# Patient Record
Sex: Male | Born: 1935 | Race: White | Hispanic: No | Marital: Married | Smoking: Never smoker
Health system: Southern US, Community
[De-identification: ages and names within clinical notes are randomized; demographics above are authoritative.]

## PROBLEM LIST (undated history)

## (undated) DIAGNOSIS — E119 Type 2 diabetes mellitus without complications: Secondary | ICD-10-CM

## (undated) DIAGNOSIS — K648 Other hemorrhoids: Secondary | ICD-10-CM

## (undated) DIAGNOSIS — K5790 Diverticulosis of intestine, part unspecified, without perforation or abscess without bleeding: Secondary | ICD-10-CM

## (undated) DIAGNOSIS — Z86010 Personal history of colonic polyps: Secondary | ICD-10-CM

## (undated) DIAGNOSIS — Z800 Family history of malignant neoplasm of digestive organs: Secondary | ICD-10-CM

## (undated) DIAGNOSIS — R350 Frequency of micturition: Secondary | ICD-10-CM

## (undated) HISTORY — DX: Diverticulosis of intestine, part unspecified, without perforation or abscess without bleeding: K57.90

## (undated) HISTORY — DX: Type 2 diabetes mellitus without complications: E11.9

## (undated) HISTORY — DX: Personal history of colonic polyps: Z86.010

## (undated) HISTORY — DX: Frequency of micturition: R35.0

## (undated) HISTORY — DX: Family history of malignant neoplasm of digestive organs: Z80.0

## (undated) HISTORY — DX: Other hemorrhoids: K64.8

## (undated) HISTORY — PX: APPENDECTOMY: SHX54

---

## 1998-03-12 ENCOUNTER — Emergency Department (HOSPITAL_COMMUNITY): Admission: EM | Admit: 1998-03-12 | Discharge: 1998-03-12 | Payer: Self-pay | Admitting: Emergency Medicine

## 1998-03-12 ENCOUNTER — Encounter: Payer: Self-pay | Admitting: Emergency Medicine

## 1998-03-14 ENCOUNTER — Encounter: Payer: Self-pay | Admitting: Internal Medicine

## 1998-03-14 ENCOUNTER — Ambulatory Visit (HOSPITAL_COMMUNITY): Admission: RE | Admit: 1998-03-14 | Discharge: 1998-03-14 | Payer: Self-pay | Admitting: Internal Medicine

## 2000-11-16 ENCOUNTER — Encounter: Admission: RE | Admit: 2000-11-16 | Discharge: 2000-11-16 | Payer: Self-pay | Admitting: *Deleted

## 2001-05-23 ENCOUNTER — Encounter: Admission: RE | Admit: 2001-05-23 | Discharge: 2001-06-09 | Payer: Self-pay | Admitting: Neurosurgery

## 2003-04-04 ENCOUNTER — Encounter: Payer: Self-pay | Admitting: Internal Medicine

## 2004-05-01 ENCOUNTER — Ambulatory Visit: Payer: Self-pay | Admitting: Family Medicine

## 2004-05-15 ENCOUNTER — Ambulatory Visit: Payer: Self-pay | Admitting: Family Medicine

## 2005-05-19 ENCOUNTER — Ambulatory Visit: Payer: Self-pay | Admitting: Family Medicine

## 2005-06-04 ENCOUNTER — Ambulatory Visit: Payer: Self-pay | Admitting: Internal Medicine

## 2005-06-04 ENCOUNTER — Inpatient Hospital Stay (HOSPITAL_COMMUNITY): Admission: EM | Admit: 2005-06-04 | Discharge: 2005-06-08 | Payer: Self-pay | Admitting: Emergency Medicine

## 2005-07-29 ENCOUNTER — Ambulatory Visit: Payer: Self-pay | Admitting: Family Medicine

## 2005-09-30 ENCOUNTER — Ambulatory Visit: Payer: Self-pay | Admitting: Family Medicine

## 2006-03-26 ENCOUNTER — Ambulatory Visit: Payer: Self-pay | Admitting: Internal Medicine

## 2006-05-20 ENCOUNTER — Ambulatory Visit: Payer: Self-pay | Admitting: Family Medicine

## 2006-05-20 LAB — CONVERTED CEMR LAB: Hgb A1c MFr Bld: 6.3 % — ABNORMAL HIGH (ref 4.6–6.0)

## 2006-07-26 ENCOUNTER — Ambulatory Visit: Payer: Self-pay | Admitting: Internal Medicine

## 2006-12-15 ENCOUNTER — Ambulatory Visit: Payer: Self-pay | Admitting: Family Medicine

## 2006-12-15 DIAGNOSIS — E119 Type 2 diabetes mellitus without complications: Secondary | ICD-10-CM | POA: Insufficient documentation

## 2006-12-15 LAB — CONVERTED CEMR LAB
Glucose, Urine, Semiquant: NEGATIVE
Protein, U semiquant: NEGATIVE
Specific Gravity, Urine: <1.005
WBC Urine, dipstick: NEGATIVE

## 2007-01-25 ENCOUNTER — Ambulatory Visit: Payer: Self-pay | Admitting: Family Medicine

## 2007-01-25 DIAGNOSIS — IMO0002 Reserved for concepts with insufficient information to code with codable children: Secondary | ICD-10-CM

## 2007-01-25 DIAGNOSIS — M1710 Unilateral primary osteoarthritis, unspecified knee: Secondary | ICD-10-CM | POA: Insufficient documentation

## 2007-01-25 DIAGNOSIS — T50995A Adverse effect of other drugs, medicaments and biological substances, initial encounter: Secondary | ICD-10-CM

## 2007-01-25 DIAGNOSIS — D649 Anemia, unspecified: Secondary | ICD-10-CM

## 2007-01-25 DIAGNOSIS — E785 Hyperlipidemia, unspecified: Secondary | ICD-10-CM

## 2007-01-25 DIAGNOSIS — E039 Hypothyroidism, unspecified: Secondary | ICD-10-CM | POA: Insufficient documentation

## 2007-01-31 LAB — CONVERTED CEMR LAB
AST: 23 units/L (ref 0–37)
Bilirubin, Direct: 0.4 mg/dL — ABNORMAL HIGH (ref 0.0–0.3)
Chloride: 103 meq/L (ref 96–112)
Creatinine, Ser: 1.1 mg/dL (ref 0.4–1.5)
Eosinophils Absolute: 0.2 10*3/uL (ref 0.0–0.6)
Eosinophils Relative: 2.5 % (ref 0.0–5.0)
Glucose, Bld: 100 mg/dL — ABNORMAL HIGH (ref 70–99)
HCT: 43.9 % (ref 39.0–52.0)
Hgb A1c MFr Bld: 6.1 % — ABNORMAL HIGH (ref 4.6–6.0)
MCV: 89.6 fL (ref 78.0–100.0)
Neutrophils Relative %: 70.2 % (ref 43.0–77.0)
RBC: 4.91 M/uL (ref 4.22–5.81)
RDW: 12.1 % (ref 11.5–14.6)
Sodium: 141 meq/L (ref 135–145)
Total Bilirubin: 1.7 mg/dL — ABNORMAL HIGH (ref 0.3–1.2)
Total CHOL/HDL Ratio: 5.3
Total Protein: 6.6 g/dL (ref 6.0–8.3)
WBC: 6.3 10*3/uL (ref 4.5–10.5)

## 2007-05-04 DIAGNOSIS — K5730 Diverticulosis of large intestine without perforation or abscess without bleeding: Secondary | ICD-10-CM | POA: Insufficient documentation

## 2007-05-04 DIAGNOSIS — K648 Other hemorrhoids: Secondary | ICD-10-CM | POA: Insufficient documentation

## 2008-01-07 ENCOUNTER — Ambulatory Visit: Payer: Self-pay | Admitting: Internal Medicine

## 2008-01-07 ENCOUNTER — Inpatient Hospital Stay (HOSPITAL_COMMUNITY): Admission: EM | Admit: 2008-01-07 | Discharge: 2008-01-10 | Payer: Self-pay | Admitting: Emergency Medicine

## 2008-01-17 ENCOUNTER — Ambulatory Visit: Payer: Self-pay | Admitting: Family Medicine

## 2008-01-17 DIAGNOSIS — R609 Edema, unspecified: Secondary | ICD-10-CM

## 2008-01-17 DIAGNOSIS — I4891 Unspecified atrial fibrillation: Secondary | ICD-10-CM

## 2008-03-08 ENCOUNTER — Ambulatory Visit: Payer: Self-pay | Admitting: Internal Medicine

## 2008-07-18 ENCOUNTER — Ambulatory Visit: Payer: Self-pay | Admitting: Family Medicine

## 2008-07-18 DIAGNOSIS — E559 Vitamin D deficiency, unspecified: Secondary | ICD-10-CM | POA: Insufficient documentation

## 2008-07-18 DIAGNOSIS — Z87898 Personal history of other specified conditions: Secondary | ICD-10-CM

## 2008-07-18 LAB — CONVERTED CEMR LAB
Blood in Urine, dipstick: NEGATIVE
Nitrite: NEGATIVE
Protein, U semiquant: NEGATIVE
WBC Urine, dipstick: NEGATIVE
pH: 5

## 2008-07-20 ENCOUNTER — Ambulatory Visit: Payer: Self-pay | Admitting: Family Medicine

## 2008-07-20 LAB — CONVERTED CEMR LAB: OCCULT 1: NEGATIVE

## 2008-07-25 ENCOUNTER — Encounter: Payer: Self-pay | Admitting: Family Medicine

## 2008-07-31 LAB — CONVERTED CEMR LAB
AST: 30 units/L (ref 0–37)
Albumin: 4 g/dL (ref 3.5–5.2)
Alkaline Phosphatase: 85 units/L (ref 39–117)
Basophils Absolute: 0 10*3/uL (ref 0.0–0.1)
Bilirubin, Direct: 0.3 mg/dL (ref 0.0–0.3)
Calcium: 9.4 mg/dL (ref 8.4–10.5)
Creatinine, Ser: 1 mg/dL (ref 0.4–1.5)
Creatinine,U: 45.5 mg/dL
GFR calc non Af Amer: 74.52 mL/min (ref >60)
Glucose, Bld: 124 mg/dL — ABNORMAL HIGH (ref 70–99)
HDL: 44.2 mg/dL (ref >39.00)
Hemoglobin: 14.5 g/dL (ref 13.0–17.0)
Lymphocytes Relative: 19.3 % (ref 12.0–46.0)
Microalb Creat Ratio: 17.6 mg/g (ref 0.0–30.0)
Microalb, Ur: 0.8 mg/dL (ref 0.0–1.9)
Monocytes Relative: 8.9 % (ref 3.0–12.0)
Neutro Abs: 3.5 10*3/uL (ref 1.4–7.7)
Neutrophils Relative %: 67.8 % (ref 43.0–77.0)
PSA: 0.57 ng/mL (ref 0.10–4.00)
RDW: 13.1 % (ref 11.5–14.6)
Sodium: 144 meq/L (ref 135–145)
Total Bilirubin: 1.7 mg/dL — ABNORMAL HIGH (ref 0.3–1.2)
Total CHOL/HDL Ratio: 4
VLDL: 14.4 mg/dL (ref 0.0–40.0)
Vit D, 25-Hydroxy: 34 ng/mL (ref 30–89)

## 2008-09-10 ENCOUNTER — Ambulatory Visit: Payer: Self-pay | Admitting: Family Medicine

## 2008-09-11 ENCOUNTER — Encounter (INDEPENDENT_AMBULATORY_CARE_PROVIDER_SITE_OTHER): Payer: Self-pay | Admitting: *Deleted

## 2008-09-19 LAB — CONVERTED CEMR LAB
Alkaline Phosphatase: 64 units/L (ref 39–117)
BUN: 19 mg/dL (ref 6–23)
CO2: 30 meq/L (ref 19–32)
Creatinine, Ser: 1.1 mg/dL (ref 0.4–1.5)
GFR calc non Af Amer: 66.74 mL/min (ref >60)
Glucose, Bld: 128 mg/dL — ABNORMAL HIGH (ref 70–99)
Total Bilirubin: 1.3 mg/dL — ABNORMAL HIGH (ref 0.3–1.2)

## 2008-10-11 ENCOUNTER — Encounter (INDEPENDENT_AMBULATORY_CARE_PROVIDER_SITE_OTHER): Payer: Self-pay | Admitting: *Deleted

## 2009-04-03 ENCOUNTER — Emergency Department (HOSPITAL_COMMUNITY): Admission: EM | Admit: 2009-04-03 | Discharge: 2009-04-03 | Payer: Self-pay | Admitting: Emergency Medicine

## 2009-08-26 ENCOUNTER — Ambulatory Visit: Payer: Self-pay | Admitting: Family Medicine

## 2009-08-26 DIAGNOSIS — R21 Rash and other nonspecific skin eruption: Secondary | ICD-10-CM

## 2009-08-27 LAB — CONVERTED CEMR LAB
CO2: 30 meq/L (ref 19–32)
Calcium: 9.3 mg/dL (ref 8.4–10.5)
Glucose, Bld: 89 mg/dL (ref 70–99)
Potassium: 4.5 meq/L (ref 3.5–5.1)
Sodium: 142 meq/L (ref 135–145)

## 2009-08-28 ENCOUNTER — Ambulatory Visit: Payer: Self-pay | Admitting: Family Medicine

## 2009-08-28 DIAGNOSIS — A6000 Herpesviral infection of urogenital system, unspecified: Secondary | ICD-10-CM | POA: Insufficient documentation

## 2009-08-28 DIAGNOSIS — D696 Thrombocytopenia, unspecified: Secondary | ICD-10-CM

## 2009-09-11 ENCOUNTER — Ambulatory Visit: Payer: Self-pay | Admitting: Family Medicine

## 2009-09-11 LAB — CONVERTED CEMR LAB
OCCULT 1: NEGATIVE
OCCULT 3: NEGATIVE

## 2009-12-02 ENCOUNTER — Emergency Department (HOSPITAL_COMMUNITY): Admission: EM | Admit: 2009-12-02 | Discharge: 2009-12-02 | Payer: Self-pay | Admitting: Emergency Medicine

## 2009-12-13 ENCOUNTER — Emergency Department (HOSPITAL_COMMUNITY)
Admission: EM | Admit: 2009-12-13 | Discharge: 2009-12-14 | Payer: Self-pay | Source: Home / Self Care | Admitting: Emergency Medicine

## 2010-02-09 LAB — CONVERTED CEMR LAB
Alkaline Phosphatase: 82 units/L (ref 39–117)
BUN: 15 mg/dL (ref 6–23)
Basophils Absolute: 0.1 10*3/uL (ref 0.0–0.1)
Basophils Relative: 1 % (ref 0.0–3.0)
Bilirubin Urine: NEGATIVE
Bilirubin, Direct: 0.3 mg/dL (ref 0.0–0.3)
Blood in Urine, dipstick: NEGATIVE
CO2: 30 meq/L (ref 19–32)
Calcium: 9.4 mg/dL (ref 8.4–10.5)
Chloride: 104 meq/L (ref 96–112)
Cholesterol: 171 mg/dL (ref 0–200)
Creatinine, Ser: 1 mg/dL (ref 0.4–1.5)
Eosinophils Absolute: 0.1 10*3/uL (ref 0.0–0.7)
Eosinophils Relative: 3.3 % (ref 0.0–5.0)
HCT: 42.1 % (ref 39.0–52.0)
HDL: 41.7 mg/dL (ref >39.00)
Herpes Simplex Vrs I&II-IgM Ab (EIA): 0.29
Ketones, urine, test strip: NEGATIVE
Lymphocytes Relative: 22 % (ref 12.0–46.0)
MCHC: 34.1 g/dL (ref 30.0–36.0)
MCV: 91.4 fL (ref 78.0–100.0)
MCV: 91.4 fL (ref 78.0–100.0)
Monocytes Absolute: 0.4 10*3/uL (ref 0.1–1.0)
Monocytes Absolute: 0.5 10*3/uL (ref 0.1–1.0)
Monocytes Relative: 8.4 % (ref 3.0–12.0)
Neutrophils Relative %: 64.1 % (ref 43.0–77.0)
Neutrophils Relative %: 65.1 % (ref 43.0–77.0)
Nitrite: NEGATIVE
PSA: 1.1 ng/mL (ref 0.10–4.00)
Platelets: 109 10*3/uL — ABNORMAL LOW (ref 150.0–400.0)
RBC: 4.61 M/uL (ref 4.22–5.81)
RBC: 4.74 M/uL (ref 4.22–5.81)
RDW: 13.9 % (ref 11.5–14.6)
Specific Gravity, Urine: <1.005
Total Bilirubin: 1.9 mg/dL — ABNORMAL HIGH (ref 0.3–1.2)
Total CHOL/HDL Ratio: 4
Total Protein: 6.7 g/dL (ref 6.0–8.3)
Triglycerides: 72 mg/dL (ref 0.0–149.0)
Urobilinogen, UA: 0.2
WBC: 5.1 10*3/uL (ref 4.5–10.5)

## 2010-02-13 NOTE — Assessment & Plan Note (Signed)
Summary: swollen foot--ok per dr burchette//ccm   Vital Signs:  Patient profile:   77 year old male Weight:      161 pounds Temp:     97.5 degrees F oral BP sitting:   120 / 70  (left arm) Cuff size:   regular  Vitals Entered By: Sid Falcon LPN (August 26, 2009 1:15 PM) CC: bil ankle swelling   History of Present Illness: 2-3 days of ankle and leg swelling. No dyspnea.  No regular meds. Has diabetes but not taking meds recently (should be on Glucotrol).  Also recent rash penile shaft.  Reported hx of herpes.  Rash was slightly painful but now slighlty pruritic.  Allergies: 1)  ! Penicillin 2)  ! Pcn  Past History:  Past Medical History: Last updated: 05/04/2007 Current Problems:  HEMORRHOIDS, INTERNAL (ICD-455.0) Family Hx of COLON CANCER (ICD-153.9) DIVERTICULAR DISEASE (ICD-562.10) ADENOMATOUS COLONIC POLYP (ICD-211.3) DIABETES MELLITUS, TYPE II, CONTROLLED (ICD-250.00) FREQUENCY, URINARY (ICD-788.41) PMH reviewed for relevance  Review of Systems  The patient denies fever, weight loss, chest pain, syncope, dyspnea on exertion, prolonged cough, headaches, abdominal pain, and anorexia.    Physical Exam  General:  Well-developed,well-nourished,in no acute distress; alert,appropriate and cooperative throughout examination Neck:  No deformities, masses, or tenderness noted. Lungs:  Normal respiratory effort, chest expands symmetrically. Lungs are clear to auscultation, no crackles or wheezes. Heart:  occ irregular beat. Genitalia:  on penile shaft couple of healing skin lesions 2mm -3mm Extremities:  trace pitting edema feet  bilateral.   Impression & Recommendations:  Problem # 1:  EDEMA (ICD-782.3) prob venous stasis.  May benefit from support hose.  Reluctant to start diuretics given relatively mild edema and low normal BP. Orders: Venipuncture (04540) Specimen Handling (98119) TLB-BMP (Basic Metabolic Panel-BMET) (80048-METABOL)  Problem # 2:  SKIN  RASH (ICD-782.1)  ?healing herpetic lesion.  Keep clean and topical antibiotic for next few days.  Orders: Venipuncture (14782) Specimen Handling (95621)  Complete Medication List: 1)  Glucotrol 5 Mg Tabs (Glipizide) .Marland Kitchen.. 1 once daily for diabetes 2)  Bayer Low Strength 81 Mg Tbec (Aspirin) .... Once daily  Patient Instructions: 1)  Elevate feet and legs frequently. 2)  Limit your Sodium(salt) .  3)  Schedule follow up with Dr Scotty Court in one week.

## 2010-02-13 NOTE — Assessment & Plan Note (Signed)
Summary: CPX PATIENT FASTING/RCD   Vital Signs:  Patient profile:   77 year old male Weight:      160 pounds Temp:     97.5 degrees F Pulse rate:   74 / minute Pulse rhythm:   irregular BP sitting:   130 / 80  Vitals Entered By: Pura Spice, RN (August 28, 2009 10:31 AM) CC: go over problems fasting  refill  Is Patient Diabetic? Yes   History of Present Illness: This 77 year old white male presented for his problems as well as refill his medications and follow up on his diabetes mellitus with a hemoglobin A1C patient has history of atrial fibrillation normal the care of Dr. Ladona Ridgel. EKG today pain history of brittle atrophia but no ischemia CBG this a.m. was 1:15. Patient relates he takes his clip aside a cord tube blood sugar level Arthritis of his knee is much better He complains of some rash on the glans penis  Allergies: 1)  ! Penicillin 2)  ! Pcn  Past History:  Past Medical History: Last updated: 05/04/2007 Current Problems:  HEMORRHOIDS, INTERNAL (ICD-455.0) Family Hx of COLON CANCER (ICD-153.9) DIVERTICULAR DISEASE (ICD-562.10) ADENOMATOUS COLONIC POLYP (ICD-211.3) DIABETES MELLITUS, TYPE II, CONTROLLED (ICD-250.00) FREQUENCY, URINARY (ICD-788.41)  Review of Systems      See HPI  The patient denies anorexia, fever, weight loss, weight gain, vision loss, decreased hearing, hoarseness, chest pain, syncope, dyspnea on exertion, peripheral edema, prolonged cough, headaches, hemoptysis, abdominal pain, melena, hematochezia, severe indigestion/heartburn, hematuria, incontinence, genital sores, muscle weakness, suspicious skin lesions, transient blindness, difficulty walking, depression, unusual weight change, abnormal bleeding, enlarged lymph nodes, angioedema, breast masses, and testicular masses.    Physical Exam  General:  Well-developed,well-nourished,in no acute distress; alert,appropriate and cooperative throughout exami Head:  Normocephalic and atraumatic  without obvious abnormalities. No apparent alopecia or balding. Eyes:  No corneal or conjunctival inflammation noted. EOMI. Perrla. Funduscopic exam benign, without hemorrhages, exudates or papilledema. Vision grossly normal. Ears:  External ear exam shows no significant lesions or deformities.  Otoscopic examination reveals clear canals, tympanic membranes are intact bilaterally without bulging, retraction, inflammation or discharge. Hearing is grossly normal bilaterally. Nose:  External nasal examination shows no deformity or inflammation. Nasal mucosa are pink and moist without lesions or exudates. Mouth:  Oral mucosa and oropharynx without lesions or exudates.  Teeth in good repair. Neck:  No deformities, masses, or tenderness noted. Chest Wall:  No deformities, masses, tenderness or gynecomastia noted. Breasts:  No masses or gynecomastia noted Lungs:  Normal respiratory effort, chest expands symmetrically. Lungs are clear to auscultation, no crackles or wheezes. Heart:  irregular rateno murmur, no gallop, no rub, no JVD, no HJR, physiological split S2, and no lifts.   Abdomen:  Bowel sounds positive,abdomen soft and non-tender without masses, organomegaly or hernias noted. Rectal:  No external abnormalities noted. Normal sphincter tone. No rectal masses or tenderness. Genitalia:  past vesicular rash over the dorsal surface of the glans penis Prostate:  1+ enlarged.   Msk:  kyphosclisis the thoracic lumbar spine Pulses:  R and L carotid,radial,femoral,dorsalis pedis and posterior tibial pulses are full and equal bilaterally Extremities:  No clubbing, cyanosis, edema, or deformity noted with normal full range of motion of all joints.   Neurologic:  No cranial nerve deficits noted. Station and gait are normal. Plantar reflexes are down-going bilaterally. DTRs are symmetrical throughout. Sensory, motor and coordinative functions appear intact. Skin:  vesicular rash of the penis otherwise  negative Cervical Nodes:  No lymphadenopathy  noted Axillary Nodes:  No palpable lymphadenopathy Inguinal Nodes:  No significant adenopathy Psych:  Cognition and judgment appear intact. Alert and cooperative with normal attention span and concentration. No apparent delusions, illusions, hallucinations   Impression & Recommendations:  Problem # 1:  THROMBOCYTOPENIA (ICD-287.5) Assessment New  Orders: TLB-CBC Platelet - w/Differential (85025-CBCD)  Problem # 2:  GENITAL HERPES (ICD-054.10) Assessment: New  Orders: T- * Misc. Laboratory test [phone]) Prescription Created Electronically [phone]) Valtrex 500 mg t.i.d.  Problem # 3:  BENIGN PROSTATIC HYPERTROPHY, HX OF (ICD-V13.8) Assessment: Unchanged  Orders: TLB-PSA (Prostate Specific Antigen) (84153-PSA)  Problem # 4:  ATRIAL FIBRILLATION WITH SLOW VENTRICULAR RESPONSE (ICD-427.31) Assessment: Unchanged  His updated medication list for this problem includes:    Bayer Low Strength 81 Mg Tbec (Aspirin) .Marland Kitchen... 1 two times a day atrial fib  Orders: EKG w/ Interpretation (93000)  Problem # 5:  DIABETES MELLITUS, TYPE II, CONTROLLED (ICD-250.00) Assessment: Improved  His updated medication list for this problem includes:    Glucotrol 5 Mg Tabs (Glipizide) .Marland Kitchen... 1 once daily for diabetes    Bayer Low Strength 81 Mg Tbec (Aspirin) .Marland Kitchen... 1 two times a day atrial fib  Orders: TLB-A1C / Hgb A1C (Glycohemoglobin) (83036-A1C)  Complete Medication List: 1)  Glucotrol 5 Mg Tabs (Glipizide) .Marland Kitchen.. 1 once daily for diabetes 2)  Bayer Low Strength 81 Mg Tbec (Aspirin) .Marland Kitchen.. 1 two times a day atrial fib 3)  Bactroban 2 % Oint (Mupirocin) .... Apply two times a day for penile lesion,infection 4)  Valtrex 500 Mg Tabs (Valacyclovir hcl) .Marland Kitchen.. 1 tid  Other Orders: UA Dipstick w/o Micro (automated)  (81003) Venipuncture (56387) T-Vitamin D (25-Hydroxy) (56433-29518) TLB-Lipid Panel (80061-LIPID) TLB-BMP (Basic Metabolic Panel-BMET)  (80048-METABOL) TLB-Hepatic/Liver Function Pnl (80076-HEPATIC) TLB-TSH (Thyroid Stimulating Hormone) (84443-TSH)  Patient Instructions: 1)  we'll call lab results 2)  Valtrex 1 mg 3 times daily for her rash on the penis 3)  Continue her diabetic medication and will notify you if there is any change Prescriptions: VALTREX 500 MG TABS (VALACYCLOVIR HCL) 1 tid  #30 x 1   Entered and Authorized by:   Judithann Sheen MD   Signed by:   Judithann Sheen MD on 09/04/2009   Method used:   Electronically to        [id number] (retail)       [address]       [place], [place]  [address]       Ph: [phone] or [phone]       Fax: [phone]   RxID:   [id number] BACTROBAN 2 % OINT (MUPIROCIN) apply two times a day for penile lesion,infection  #15 gms x 1   Entered and Authorized by:   Judithann Sheen MD   Signed by:   Judithann Sheen MD on 08/28/2009   Method used:   Electronically to        [phone] (retail)       [address]       [place], [place]  [address]       Ph: [phone] or [phone]       Fax: [phone]   RxID:   [id number]     Laboratory Results   Urine Tests  Date/Time Recieved: August 28, 2009 1:22 PM  Date/Time Reported: August 28, 2009 1:22 PM   Routine Urinalysis   Color: yellow Appearance: Clear Glucose: negative   (Normal Range: Negative) Bilirubin: negative   (  Normal Range: Negative) Ketone: negative   (Normal Range: Negative) Spec. Gravity: <1.005   (Normal Range: 1.003-1.035) Blood: negative   (Normal Range: Negative) pH: 5.0   (Normal Range: 5.0-8.0) Protein: negative   (Normal Range: Negative) Urobilinogen: 0.2   (Normal Range: 0-1) Nitrite: negative   (Normal Range: Negative) Leukocyte Esterace: trace   (Normal Range: Negative)    Comments: Wynona Canes, CMA  August 28, 2009 1:22 PM

## 2010-03-19 ENCOUNTER — Other Ambulatory Visit: Payer: Self-pay | Admitting: Dermatology

## 2010-03-24 LAB — URINE CULTURE
Colony Count: >=100000
Culture  Setup Time: 201112030605

## 2010-03-24 LAB — URINALYSIS, ROUTINE W REFLEX MICROSCOPIC
Glucose, UA: 500 mg/dL — AB
Nitrite: NEGATIVE
Protein, ur: >300 mg/dL — AB
Urobilinogen, UA: 1 mg/dL (ref 0.0–1.0)

## 2010-03-24 LAB — URINE MICROSCOPIC-ADD ON

## 2010-03-25 LAB — URINALYSIS, ROUTINE W REFLEX MICROSCOPIC
Bilirubin Urine: NEGATIVE
Hgb urine dipstick: NEGATIVE
Ketones, ur: NEGATIVE mg/dL
Specific Gravity, Urine: 1.019 (ref 1.005–1.030)
Urobilinogen, UA: 1 mg/dL (ref 0.0–1.0)

## 2010-04-06 LAB — CBC
HCT: 43.4 % (ref 39.0–52.0)
Hemoglobin: 14.5 g/dL (ref 13.0–17.0)
WBC: 9.7 10*3/uL (ref 4.0–10.5)

## 2010-04-06 LAB — DIFFERENTIAL
Eosinophils Relative: 1 % (ref 0–5)
Lymphocytes Relative: 8 % — ABNORMAL LOW (ref 12–46)
Lymphs Abs: 0.8 10*3/uL (ref 0.7–4.0)
Monocytes Absolute: 0.5 10*3/uL (ref 0.1–1.0)

## 2010-04-06 LAB — URINALYSIS, ROUTINE W REFLEX MICROSCOPIC
Glucose, UA: 250 mg/dL — AB
Protein, ur: >300 mg/dL — AB

## 2010-04-06 LAB — BASIC METABOLIC PANEL
Chloride: 108 mEq/L (ref 96–112)
GFR calc non Af Amer: >60 mL/min (ref >60)
Glucose, Bld: 137 mg/dL — ABNORMAL HIGH (ref 70–99)
Potassium: 3.9 mEq/L (ref 3.5–5.1)
Sodium: 140 mEq/L (ref 135–145)

## 2010-04-06 LAB — URINE MICROSCOPIC-ADD ON

## 2010-05-27 NOTE — Discharge Summary (Signed)
NAME:  Lawrence Coleman, Lawrence Coleman NO.:  [id number]   MEDICAL RECORD NO.:  [id number]          PATIENT TYPE:  INP   LOCATION:  3711                         FACILITY:  [facility]   PHYSICIAN:  Valerie A. Felicity Coyer, MDDATE OF BIRTH:  1915/11/27   DATE OF ADMISSION:  01/07/2008  DATE OF DISCHARGE:  01/10/2008                               DISCHARGE SUMMARY   PRIMARY CARE PHYSICIAN:  Tawny Asal, MD   DISCHARGE DIAGNOSES:  1. Left lower lobe pneumonia.  2. New-onset atrial fibrillation with rapid ventricular response      secondary to left lower lobe pneumonia.  3. Thrombocytopenia.   HISTORY OF PRESENT ILLNESS:  Lawrence Coleman is a very pleasant 77 year old  white male with past medical history of diabetes who presented to [facility] Emergency Room on day of admission with reports of 2-day history of  fever and cough.  The patient also reported on day of admission he had 1  episode of emesis and noticed that his heart was beating quite fast, at  which time he called EMS.  Upon evaluation in the ER, the patient found  to have a heart rate of 120 in atrial fibrillation with blood pressure  88/55 that responded well to IV fluid hydration.  Chest x-ray done at  the time of admission revealed left lower lobe pneumonia.  The patient  was admitted at that time for early shock pneumonia and new-onset AFib.   PAST MEDICAL HISTORY:  Type 2 diabetes.   COURSE OF HOSPITALIZATION:  1. Left lower lobe pneumonia with early shock.  As mentioned above,      the patient with low systolic blood pressure at the time of      admission.  He was admitted to the [facility] Unit for close      monitoring.  He was started on azithromycin and Rocephin.  The      patient responded well to IV fluids and antibiotic treatment.  At      this time, his O2 sats remained stable.  He has been afebrile with      normal white cell count.  We will continue antibiotic therapy for a      total of 10 days' treatment  at the time of discharge.  2. New-onset atrial fibrillation with RVR.  Arrhythmia likely      secondary to number left lower lobe pneumonia with hypoxia.  The      patient was seen in consultation by [facility]      who recommended placing the patient on heparin drip.  At this time,      the patient has converted to a normal sinus rhythm on p.o.      diltiazem.  The patient is not an ideal anticoagulation candidate      given advanced age and high fall risk.  We will discharge the      patient home on low-dose diltiazem and increase aspirin to full      dose to be taken daily.  Also of note, the patient with severe  diverticular disease; therefore, Coumadin certainly not ideal on      this patient.  Again, the patient has converted to a normal sinus      rhythm at the time of dictation.  3. Thrombocytopenia.  Seemingly chronic for this patient.  Discharge      platelet count of 81.  We will defer further outpatient followup to      the patient's primary care physician.   MEDICATIONS AT THE TIME OF DISCHARGE:  1. Ceftin 250 mg 1 tablet p.o. b.i.d. until gone.  2. Azithromycin 500 mg p.o. daily until gone.  3. Diltiazem 120 mg p.o. b.i.d.  Note, this is a new medication.  4. Aspirin 325 mg p.o. daily.  5. Glipizide 5 mg p.o. daily.   PERTINENT LABORATORY WORK:  At the time of discharge, white cell count  6.5, platelet count 81, hemoglobin 11.9, hematocrit 35.2.  Sodium 141,  potassium 3.5, BUN 14, creatinine 0.98.  Blood cultures x2 obtained on  January 07, 2008, were negative for any growth.  Urine culture was  negative for any growth.   DISPOSITION:  The patient felt medically stable for discharge home at  this time.  Prior to discharge, the patient will be seen in consultation  by physical and occupational therapy and any recommendations will be  arranged prior to discharge.  The patient is also instructed to follow  up with his primary care physician, Dr.  Dianna Limbo on Tuesday,  January 17, 2008, at 2:30 p.m.  The patient is instructed to call the  office or return to the ER for any recurrent heart palpitations or  shortness of breath.      Cordelia Pen, NP      Raenette Rover. Felicity Coyer, MD  Electronically Signed    LE/MEDQ  D:  01/10/2008  T:  01/10/2008  Job:  [id number]   cc:   Ellin Saba., MD

## 2010-05-27 NOTE — Assessment & Plan Note (Signed)
[facility]                         GASTROENTEROLOGY OFFICE NOTE   GIANN, OBARA                      MRN:          [id number]  DATE:07/26/2006                            DOB:          July 06, 1915    Mr. Lawrence Coleman is an 77 year old gentleman who is here today again to  discuss colonoscopy. We saw him in March of this year for the same  thing. He has passed a colonoscopy, last one in March 2005, with  findings of two adenomatous polyps of the colon, one in the sigmoid and  one in the cecum. He also has moderately severe diverticular disease of  the left colon. He has mild constipation, but does not take any  laxatives. He has a positive family history of colon cancer in his  brother, who died from it. The patient was scheduled for a colonoscopy  for 5 year interval which would be March 2010. During our discussion in  March 2008, the patient declined having colonoscopy. I asked him to come  back if his symptoms or if he developed any bleeding or any specific  problems. He came back today, but again says that he does not have any  problems and again we went through the whole discussion of colonoscopy.   PHYSICAL EXAMINATION:  Blood pressure 104/70. Pulse 64. Weight 169  pounds. He is alert, oriented in no distress. He had kyphosis of the  thoracic spine.  LUNGS:  Clear to auscultation.  COR: With normal S1, normal S2 with occasional premature beat.  ABDOMEN: Soft and nontender with normoactive bowel sounds.  RECTAL: Reveals soft brown, Hemoccult negative, stool.   IMPRESSION:  An 77 year old white male with a family history of colon  cancer in brother and personal history of adenomatous polyps x2 in 2005.  He is not interested in having colonoscopy and I agree with him. He  remains Hemoccult negative. He will return on a p.r.n. basis. He should  have a repeat colonoscopy in two years at age of 59, but I would advise  that he has an appointment with  me first because of at that age we are  not likely to do a colonoscopy because of risks exceeding the benefits.     Hedwig Morton. Juanda Chance, MD  Electronically Signed    DMB/MedQ  DD: 07/26/2006  DT: 07/26/2006  Job #: [id number]   cc:   Ellin Saba., MD

## 2010-05-27 NOTE — Letter (Signed)
March 08, 2008    Tawny Asal, MD  [address] [place], [place] [address]   RE:  [name], [name]  MRN:  [id number]  /  DOB:  [birth year]   Dear Will:   Thank you for referring Lawrence Coleman for EP evaluation of his atrial  fibrillation.  As you know, he is a very pleasant 77 year old man who  has known you for a long time.  He has a history of recent pneumonia and  palpitations and was found to be in atrial fibrillation.  Prior to  discharge, he had referred back to sinus rhythm.  He occasionally has  palpitations and is here today for additional evaluation.  Please note  that a copy of my clinic visit should follow this letter.  In summary,  despite [name]'s advanced age, I instructed him that there is still  advantage from clotting perspective for Coumadin; however, with his  diverticulosis, I must admit that his risk of bleeding has also  increased.  With this all being set in the discussions of bleeding  versus clotting carefully laid out, the patient prefers to continue on  his aspirin therapy alone.  There is no medication that I can give him  per se that will reduce his stroke risk.  Finally, we talked about  rhythm versus rate control of his AFib and he had been on some Cardizem,  but stopped it on his own as he has thought that did not help him much.  At this point, I have recommended period of  watchful waiting for [name].  Obviously if his symptoms worsen, then  we would consider additional AV nodal blocking drugs or perhaps even  antiarrhythmic drug therapy for him, but for now I think probably in his  case less is more and we will watch him expectantly.  Thanks again for  referring [name] for EP evaluation.    Sincerely,      Lawrence Canning. Ladona Ridgel, MD  Electronically Signed    GWT/MedQ  DD: 03/08/2008  DT: 03/09/2008  Job #: [id number]

## 2010-05-27 NOTE — Assessment & Plan Note (Signed)
[facility]                         ELECTROPHYSIOLOGY OFFICE NOTE   SPARROW, SANZO                      MRN:          [id number]  DATE:03/08/2008                            DOB:          Jan 10, 1916    Lawrence Coleman is referred today by Dr. Scotty Coleman for evaluation of atrial  fibrillation.  The patient is a very pleasant 77 year old man whose  health has been quite good.  He has diet-controlled diabetes and was  admitted to the hospital with pneumonia several weeks ago and at that  time found to be in atrial fibrillation.  He subsequently gone back to  sinus rhythm.  The patient states that his heart races at time and he  feels palpitations, but has never had any other symptoms.  Initially, he  was thought not to be a great candidate for Coumadin because of his  advanced age and propensity for instability on his feet.  He also has a  history of diverticulosis and he has been followed by Dr. Juanda Coleman in the  past for this.  The patient denies syncope.  He is not particularly  bothered by his AFib in fact he had been on calcium channel blockers,  but decided to stop because he could not tell that it was making any  improvement.  In general, the patient does not like to take medications.  His additional past medical history is really as noted above.  He has a  history of polyps and these were removed in 2005.   FAMILY HISTORY:  Notable for colon cancer.   SOCIAL HISTORY:  The patient is married and denies tobacco or ethanol  abuse.   REVIEW OF SYSTEMS:  Negative except for occasional palpitations.  There  are no other symptoms.  Otherwise, all systems reviewed are negative.   PHYSICAL EXAMINATION:  GENERAL:  He is a pleasant elderly man in no  acute distress.  VITAL SIGNS:  Blood pressure today is 112/70, the pulse is 93 and  regular, respirations were 18, the weight was 169 pounds.  NECK:  No jugular venous distention.  LUNGS:  Clear bilaterally to  auscultation.  No wheezes, rales, or  rhonchi are present.  There is no increased work of breathing.  CARDIOVASCULAR:  Irregular rhythm with normal S1 and S2.  Soft systolic  murmur at the left lower sternal border.  PMI is enlarged and laterally  displaced.  ABDOMEN:  Soft, nontender.  EXTREMITIES:  No cyanosis, clubbing, or edema.  The pulses are 2+ and  symmetric.  NEUROLOGIC:  Alert and oriented x3.  Cranial nerves intact.  Strength is  5/5 and symmetric.   EKG demonstrates sinus rhythm with PACs.   IMPRESSION:  1. Paroxysmal atrial fibrillation.  2. History of diverticulosis.   DISCUSSION:  I have discussed the treatment options with [name].  Ultimately, he would be better off on Coumadin, but he is uninclined to  take this and I can argue somewhat as there is a risk of bleeding and  falling.  For now, he will continue on low-dose aspirin.  He will  continue  on with  his current medications.  I have instructed him to call us if he  has worsening symptoms of AFib.     Lawrence Canning. Ladona Ridgel, MD  Electronically Signed    GWT/MedQ  DD: 03/08/2008  DT: 03/09/2008  Job #: [id number]   cc:   Lawrence Coleman., MD

## 2010-05-27 NOTE — H&P (Signed)
NAME:  Lawrence Coleman, Lawrence Coleman NO.:  [id number]   MEDICAL RECORD NO.:  [id number]          PATIENT TYPE:  INP   LOCATION:  3303                         FACILITY:  [facility]   PHYSICIAN:  Gardiner Barefoot, MD    DATE OF BIRTH:  Dec 20, 1915   DATE OF ADMISSION:  01/07/2008  DATE OF DISCHARGE:                              HISTORY & PHYSICAL   PRIMARY CARE PHYSICIAN:  Tawny Asal, MD.   CHIEF COMPLAINT:  Fever.   HISTORY OF PRESENT ILLNESS:  This is a 77 year old male with a history  of diabetes who presents with a 2-day history of fever and cough.  The  patient had an episode of emesis, noticed that his heart was beating  fast, and called the EMS.  The patient and his wife report that he has  been otherwise his normal self.  He did not have flu shot this year.  The patient denies any weight loss or other systemic symptoms.   PAST MEDICAL HISTORY:  Diabetes.   MEDICATIONS:  Glipizide 5 mg oral daily.   ALLERGIES:  No known drug allergies.   SOCIAL HISTORY:  The patient is a nonsmoker, nondrinker, and no history  of drug use.   FAMILY HISTORY:  Noncontributory.   REVIEW OF SYSTEMS:  Negative except as per the history of present  illness.   PHYSICAL EXAMINATION:  VITAL SIGNS:  Temperature is 98.0, pulse is 120,  respirations 20, blood pressure is 88 to 100/55.  GENERAL:  The patient is awake, alert, and oriented x3, and appears in  no acute distress.  CARDIOVASCULAR:  Irregularly irregular, tachycardic.  No murmurs, rubs,  or gallops appreciated.  LUNGS:  Notable for left-sided crackles, left base.  ABDOMEN:  Soft, nontender, and nondistended.  Positive bowel sounds.  No  hepatosplenomegaly.  EXTREMITIES:  No edema.   LABORATORY DATA:  Chest x-ray with left lower lobe pneumonia, otherwise  negative.  INR 1.3.  Lipase 18.  CMP notable for a glucose of 208 and a  decreased GFR, total bilirubin is 2.2, and albumin is 3.2.  CK-MB is  less than 1 and troponin is  less than 0.05.  WBC is 12.4 with 90%  neutrophils, hemoglobin 13, and platelets 87.  Sodium 139, potassium  3.8, chloride 106, BUN 19, and creatinine 1.1.   ASSESSMENT AND PLAN:  1. Left lower lobe pneumonia.  The patient may require pneumonia      treatment with azithromycin and ceftriaxone.  Will use Tylenol for      fever, and the patient will be admitted to a step-down unit as he      has had low blood pressure.  2. New atrial fibrillation.  It is likely secondary to left lower lobe      pneumonia.  Cardiology has been consulted,      and we will follow.  3. Thrombocytopenia.  It is unclear if this is new or if this is a      transient process secondary to infection.  We will hold off on DVT      prophylaxis with heparin or  Lovenox and use Pneumo Boots.  We will      recheck in the a.m.      Gardiner Barefoot, MD  Electronically Signed     RWC/MEDQ  D:  01/07/2008  T:  01/08/2008  Job:  [id number]   cc:   Ellin Saba., MD

## 2010-05-30 NOTE — Discharge Summary (Signed)
NAME:  Lawrence Coleman, Lawrence Coleman               ACCOUNT NO.:  [id number]   MEDICAL RECORD NO.:  [id number]          PATIENT TYPE:  INP   LOCATION:  1307                         FACILITY:  [facility]   PHYSICIAN:  Valetta Mole. Swords, M.D. [facility] OF BIRTH:  September 22, 1915   DATE OF ADMISSION:  06/03/2005  DATE OF DISCHARGE:  06/08/2005                                 DISCHARGE SUMMARY   DISCHARGE DIAGNOSES:  1.  Salmonella gastroenteritis.  2.  Type 2 diabetes.  3.  History of surgery on a kidney for a cyst.   DISCHARGE MEDICATIONS:  1.  Glipizide 5 mg p.o. daily.  2.  Aspirin 81 mg p.o. daily.   HOSPITAL PROCEDURES:  None.   HOSPITAL LABORATORIES:  BMET at discharge was normal.  On admission he was  slightly hyponatremic with a sodium of 131, potassium was 3.1 on Jun 01, 2005.  Stool culture:  Salmonella species cultured, still preliminary  report.  No ova or parasites seen.  Hemoglobin A1c 6.4%.  Fecal white blood  cells negative.  C. difficile toxin negative.   CONDITION ON DISCHARGE:  Improved.  No diarrhea.   FOLLOW-UP PLANS:  Dr. Scotty Court, one to two weeks.   HOSPITAL COURSE:  Problem 1.  The patient admitted to the hospitalist service on Jun 03, 2005.  See Dr. Drue Novel' admission note.  The patient was treated with IV fluids.  Stool  studies were obtained.  The patient's clinical status was followed in the  hospital.  He was gradually improving.  At the time of discharge his stool  cultures revealed Salmonella, which does not require antibiotic treatment.   Problem 2.  FLUIDS, ELECTROLYTES, AND NUTRITION:  The patient hypokalemic in  the hospital.  That was replaced and is now stable.   Problem 3.  TYPE 2 DIABETES:  The patient will continue his glipizide at  home.      Bruce Rexene Edison Swords, M.D. [facility]  Electronically Signed     BHS/MEDQ  D:  06/08/2005  T:  06/08/2005  Job:  [id number]   cc:   Ellin Saba., M.D.  [address] [place]  [place] [address]

## 2010-05-30 NOTE — H&P (Signed)
NAME:  Lawrence Coleman, Lawrence Coleman NO.:  [id number]   MEDICAL RECORD NO.:  [id number]          PATIENT TYPE:  OBV   LOCATION:  0102                         FACILITY:  [facility]   PHYSICIAN:  Wanda Plump, MD [facility]    DATE OF BIRTH:  October 13, 1915   DATE OF ADMISSION:  06/03/2005  DATE OF DISCHARGE:                                HISTORY & PHYSICAL   PRIMARY CARE PHYSICIAN:  Tawny Asal, M.D. at [facility].   CHIEF COMPLAINT:  Diarrhea.   HISTORY OF PRESENT ILLNESS:  Mr. Armendariz is an 77 year old white male who  presented to the emergency room with three-day history of diarrhea. The  diarrhea is described as watery, several times a day and nonbloody. They  also noticed some fever prior to arrival to the hospital.  The only uncommon  diet that he had recently is lunch that he had one day prior to getting sick  at church.  In the emergency room, he received IV fluids and despite that he  was quite weak to the point that he could not ambulate so he was admitted  for observation and rehydration.   PAST MEDICAL HISTORY:  1.  Diabetes.  2.  Kidney surgery for a cyst many years ago.  3.  He denies any history of hypertension or coronary artery disease.   FAMILY HISTORY:  Noncontributory.   SOCIAL HISTORY:  Does not smoke or drink.  He lives independently in his  house with his wife.   REVIEW OF SYSTEMS:  The patient denies any nausea, vomiting, abdominal pain.  His appetite has not been good and he has been eating very little, mostly  because of lack of appetite.  He does not recall any recent p.o.  antibiotics.  He denies any chest pain, shortness of breath or URI type of  symptoms.   MEDICATIONS:  1.  Glipizide 5 mg 1 p.o. daily.  2.  Aspirin daily.   ALLERGIES:  No known drug allergies.   PHYSICAL EXAMINATION:  The patient is alert, oriented.  He looks weak but  certainly no distress.  He is sitting in the bed with a bed pan underneath.  Temperature was 102 in  the hospital.  Pulse 95.  He was on room air 100%.  Blood pressure 118/63, respiration 20.  Oropharynx has dry membranes.  Lungs  are clear to auscultation bilaterally.  Cardiovascular regular rate and  rhythm without a murmur.  He does have occasional irregular heart beat but  that seems to be related with his respiration.  Abdomen is not distended,  soft, good bowel sounds.  No organomegaly.  Extremities no edema.  Calves  symmetric.   LABORATORIES/X-RAYS:  White count is 3.9, platelets 98,000, hemoglobin 14.3.  Prior CBC done earlier this month at the office showed platelet count  already low at the 115.  Differential showed 81% neutrophils and 9%  lymphocytes which is slightly low.  Sodium 131, which is low, potassium 3.8,  BUN 15, creatinine 1.2, blood sugar 159.  LFTs were lymph node.  Urinalysis  was basically unremarkable.  EKG  showed no acute changes.   ASSESSMENT/PLAN:  1.  The patient is admitted with acute diarrhea and dehydration that did not      respond to IV fluid in the emergency room.  He will be admitted,      hydrated.  Stools will be sent for culture and C. difficile and      anticipate that he will be able to go home hopefully in 24 to 48 hours.  2.  His platelets are slightly low from already low baseline.  We will      simply recheck in the morning.  The white count also is slightly lower      that represents a viral infection.  3.  Slight hyponatremia:  We will also recheck in the morning.  4.  Diabetes:  We will hold the oral hypoglycemias, put him on a sliding      scale.      Wanda Plump, MD [facility]  Electronically Signed     JEP/MEDQ  D:  06/03/2005  T:  06/03/2005  Job:  [id number]   cc:   Ellin Saba., M.D.  [address] [place]  [place] [address]

## 2010-05-30 NOTE — Assessment & Plan Note (Signed)
[facility]                         GASTROENTEROLOGY OFFICE NOTE   KANNAN, PROIA                      MRN:          [id number]  DATE:03/26/2006                            DOB:          1915-07-28    Lawrence Coleman is a delightful 77 year old gentleman who is here today to  discuss repeat colonoscopy for colorectal screening. We have seen Mr.  Coleman in the past for colonoscopies with the first one being in 2000  and a repeat colonoscopy in March 2005 with findings of two adenomatous  polyps, one in the cecum and one in the left colon. He also had a  moderately severe diverticulosis of the left colon. He received a recall  letter for repeat colonoscopy. He has had hemorrhoids in the past, but  currently denies any bleeding. He has a positive family history of colon  cancer in his brother.   MEDICATIONS:  1. Aspirin 81 mg daily.  2. Glipizide 5 mg daily.   PHYSICAL EXAMINATION:  VITAL SIGNS:  Blood pressure 122/72, pulse 80,  weight 176 pounds.  GENERAL:  The patient was alert and oriented in no distress. He appeared  frail.  LUNGS:  Clear to auscultation.  BACK:  He had prominent kyphosis of the thoracic spine.  CARDIAC:  S4, S3, occasional premature beats.  ABDOMEN:  Soft and nontender with normal active bowel sounds; no bruit.  RECTAL:  2+ prostate; stool soft; hemoccult negative.   IMPRESSION:  77 year old white male with a family history of colon  cancer and a personal history of adenomatous polyp of the colon. His  initial recall colonoscopy was 3 years ago, but the guidelines have  changed to recall in 5 years for patients with adenomatous polyps. For  that reason, he is really not due for colonoscopy and an additional  reason is his age of 45. He is hemoccult negative and asymptomatic. He  has mild constipation which he manages with a high fiber diet. At this  point, the risks associated with colonoscopy would exceed the benefits,  which in this case would be the prevention of colon cancer.   I have talked to Mr. & Lawrence Coleman and advised them to hold off  colonoscopy and possibly check his hemoccult on a yearly basis. If he  develops specific problems, such as bleeding, we would make a decision  as to doing a colonoscopy at that point.     Lawrence Coleman. Juanda Chance, MD  Electronically Signed    DMB/MedQ  DD: 03/26/2006  DT: 03/28/2006  Job #: [id number]   cc:   Ellin Saba., MD

## 2010-07-24 ENCOUNTER — Encounter: Payer: Self-pay | Admitting: Family Medicine

## 2010-07-31 ENCOUNTER — Encounter: Payer: Self-pay | Admitting: Family Medicine

## 2010-07-31 ENCOUNTER — Ambulatory Visit (INDEPENDENT_AMBULATORY_CARE_PROVIDER_SITE_OTHER): Payer: Medicare Other | Admitting: Family Medicine

## 2010-07-31 DIAGNOSIS — Z87828 Personal history of other (healed) physical injury and trauma: Secondary | ICD-10-CM

## 2010-07-31 DIAGNOSIS — E039 Hypothyroidism, unspecified: Secondary | ICD-10-CM

## 2010-07-31 DIAGNOSIS — R350 Frequency of micturition: Secondary | ICD-10-CM

## 2010-07-31 DIAGNOSIS — Z23 Encounter for immunization: Secondary | ICD-10-CM

## 2010-07-31 DIAGNOSIS — R000 Tachycardia, unspecified: Secondary | ICD-10-CM

## 2010-07-31 DIAGNOSIS — E119 Type 2 diabetes mellitus without complications: Secondary | ICD-10-CM

## 2010-07-31 DIAGNOSIS — E559 Vitamin D deficiency, unspecified: Secondary | ICD-10-CM

## 2010-07-31 DIAGNOSIS — D649 Anemia, unspecified: Secondary | ICD-10-CM

## 2010-07-31 DIAGNOSIS — N401 Enlarged prostate with lower urinary tract symptoms: Secondary | ICD-10-CM

## 2010-07-31 DIAGNOSIS — N138 Other obstructive and reflux uropathy: Secondary | ICD-10-CM

## 2010-07-31 DIAGNOSIS — D696 Thrombocytopenia, unspecified: Secondary | ICD-10-CM

## 2010-07-31 DIAGNOSIS — E785 Hyperlipidemia, unspecified: Secondary | ICD-10-CM

## 2010-07-31 DIAGNOSIS — Z0000 Encounter for general adult medical examination without abnormal findings: Secondary | ICD-10-CM

## 2010-07-31 LAB — BASIC METABOLIC PANEL
CO2: 28 mEq/L (ref 19–32)
Chloride: 102 mEq/L (ref 96–112)
Glucose, Bld: 128 mg/dL — ABNORMAL HIGH (ref 70–99)
Potassium: 4.5 mEq/L (ref 3.5–5.1)
Sodium: 140 mEq/L (ref 135–145)

## 2010-07-31 LAB — POCT URINALYSIS DIPSTICK
Bilirubin, UA: NEGATIVE
Nitrite, UA: NEGATIVE
Protein, UA: NEGATIVE
Urobilinogen, UA: 1
pH, UA: 6

## 2010-07-31 LAB — CBC WITH DIFFERENTIAL/PLATELET
Basophils Absolute: 0 10*3/uL (ref 0.0–0.1)
HCT: 43.4 % (ref 39.0–52.0)
Hemoglobin: 14.6 g/dL (ref 13.0–17.0)
Lymphs Abs: 1 10*3/uL (ref 0.7–4.0)
MCHC: 33.6 g/dL (ref 30.0–36.0)
Monocytes Relative: 7.3 % (ref 3.0–12.0)
Neutro Abs: 4.4 10*3/uL (ref 1.4–7.7)
RDW: 13.7 % (ref 11.5–14.6)

## 2010-07-31 LAB — HEPATIC FUNCTION PANEL
Albumin: 4.3 g/dL (ref 3.5–5.2)
Bilirubin, Direct: 0.3 mg/dL (ref 0.0–0.3)
Total Protein: 7.3 g/dL (ref 6.0–8.3)

## 2010-07-31 LAB — LIPID PANEL: VLDL: 12.2 mg/dL (ref 0.0–40.0)

## 2010-07-31 LAB — PSA: PSA: 0.79 ng/mL (ref 0.10–4.00)

## 2010-07-31 NOTE — Patient Instructions (Signed)
You are doing well and very pleased with the physical findings and I know you will continue to do well the arthritis of the knee and ankle is not sufficient at this time to take any medication I will call you the results of the lab Continue taking the aspirin 81 mg

## 2010-08-01 LAB — VITAMIN D 25 HYDROXY (VIT D DEFICIENCY, FRACTURES): Vit D, 25-Hydroxy: 38 ng/mL (ref 30–89)

## 2010-08-04 ENCOUNTER — Other Ambulatory Visit: Payer: Self-pay

## 2010-08-04 MED ORDER — GLIPIZIDE 5 MG PO TABS: 5.0000 mg | ORAL_TABLET | Freq: Two times a day (BID) | ORAL | Status: DC

## 2010-08-04 NOTE — Progress Notes (Signed)
Pt is aware.  

## 2010-08-12 ENCOUNTER — Other Ambulatory Visit: Payer: Medicare Other

## 2010-08-12 DIAGNOSIS — D649 Anemia, unspecified: Secondary | ICD-10-CM

## 2010-08-12 DIAGNOSIS — K921 Melena: Secondary | ICD-10-CM

## 2010-08-12 LAB — HEMOCCULT GUIAC POC 1CARD (OFFICE): Fecal Occult Blood, POC: NEGATIVE

## 2010-08-13 NOTE — Progress Notes (Signed)
Pt aware.

## 2010-09-15 NOTE — Progress Notes (Signed)
  Subjective:    Patient ID: Lawrence Coleman, male    DOB: 1916/01/12, 77 y.o.   MRN: [id number] This 77 year old white male is in today to review his medical problems as well as his medications and get necessary lab studies. His complaints isn't in the past he has had some fast heart beats which occurred time but not very often. No radiation of pain no dizziness no shortness of breath, no chest pain Relates his CBGs have been good 95 -10 He relates he fell proximal months ago and his right knee has been minimally swollen and slightly painful at time Complaint of urinary frequency at times no real problem He is instructed he needs a tetanus  tab tdap desies to wait on shingles vaccine  HPI    Review of Systems no other symptoms on systems review see history of present illness     Objective:   Physical Exam the patient is a well-built well-nourished alert cooperative white male who essentially has no complaints in no distress HEENT negative carotid pulses good thyroid is normal   lungs clear palpation percussion and auscultation heart no evidence cardiomegaly heart sounds are good without murmurs regular rhythm EKG reveals first degree heart block plus low-voltage   abdomen liver spleen kidneys are nonpalpable bowel sounds normal no tenderness Genitalia normal Rectal examination reveals prostate to be enlarged x2 no nodules no tenderness Examination reveals the right knee to be minimally swollen minimal tenderness medially and laterally no limitation of motion        Assessment & Plan:  Diabetes mellitus well controlled Thrombocytopenia under control atrial fibrillation in the past but not on present EKG Benign prosthetic hypertrophy no indication for treatment at this time Arthritis right knee no indication for treatment at this time

## 2010-10-17 LAB — DIFFERENTIAL
Basophils Relative: 0 % (ref 0–1)
Lymphs Abs: 0.5 10*3/uL — ABNORMAL LOW (ref 0.7–4.0)
Monocytes Relative: 6 % (ref 3–12)
Neutro Abs: 11.1 10*3/uL — ABNORMAL HIGH (ref 1.7–7.7)
Neutrophils Relative %: 90 % — ABNORMAL HIGH (ref 43–77)

## 2010-10-17 LAB — CBC
HCT: 35.2 % — ABNORMAL LOW (ref 39.0–52.0)
HCT: 36 % — ABNORMAL LOW (ref 39.0–52.0)
HCT: 41.2 % (ref 39.0–52.0)
Hemoglobin: 11.9 g/dL — ABNORMAL LOW (ref 13.0–17.0)
Hemoglobin: 11.9 g/dL — ABNORMAL LOW (ref 13.0–17.0)
Hemoglobin: 13.9 g/dL (ref 13.0–17.0)
MCHC: 33.2 g/dL (ref 30.0–36.0)
MCHC: 33.7 g/dL (ref 30.0–36.0)
MCV: 90.7 fL (ref 78.0–100.0)
Platelets: 81 10*3/uL — ABNORMAL LOW (ref 150–400)
RBC: 3.92 MIL/uL — ABNORMAL LOW (ref 4.22–5.81)
RDW: 12.8 % (ref 11.5–15.5)
RDW: 13 % (ref 11.5–15.5)
RDW: 13.4 % (ref 11.5–15.5)
WBC: 6.5 10*3/uL (ref 4.0–10.5)

## 2010-10-17 LAB — PROTIME-INR
INR: 1.3 (ref 0.00–1.49)
Prothrombin Time: 16.8 seconds — ABNORMAL HIGH (ref 11.6–15.2)

## 2010-10-17 LAB — COMPREHENSIVE METABOLIC PANEL
BUN: 18 mg/dL (ref 6–23)
Calcium: 8.7 mg/dL (ref 8.4–10.5)
GFR calc non Af Amer: 52 mL/min — ABNORMAL LOW (ref >60)
Glucose, Bld: 208 mg/dL — ABNORMAL HIGH (ref 70–99)
Total Protein: 5.6 g/dL — ABNORMAL LOW (ref 6.0–8.3)

## 2010-10-17 LAB — POCT CARDIAC MARKERS: Myoglobin, poc: 176 ng/mL (ref 12–200)

## 2010-10-17 LAB — URINE CULTURE: Colony Count: NO GROWTH

## 2010-10-17 LAB — POCT I-STAT, CHEM 8
Calcium, Ion: 1.02 mmol/L — ABNORMAL LOW (ref 1.12–1.32)
Creatinine, Ser: 1.1 mg/dL (ref 0.4–1.5)
Hemoglobin: 13.9 g/dL (ref 13.0–17.0)
Sodium: 139 mEq/L (ref 135–145)
TCO2: 21 mmol/L (ref 0–100)

## 2010-10-17 LAB — GLUCOSE, CAPILLARY
Glucose-Capillary: 106 mg/dL — ABNORMAL HIGH (ref 70–99)
Glucose-Capillary: 116 mg/dL — ABNORMAL HIGH (ref 70–99)
Glucose-Capillary: 126 mg/dL — ABNORMAL HIGH (ref 70–99)
Glucose-Capillary: 190 mg/dL — ABNORMAL HIGH (ref 70–99)
Glucose-Capillary: 87 mg/dL (ref 70–99)
Glucose-Capillary: 98 mg/dL (ref 70–99)

## 2010-10-17 LAB — CULTURE, BLOOD (ROUTINE X 2): Culture: NO GROWTH

## 2010-10-17 LAB — BASIC METABOLIC PANEL
CO2: 26 mEq/L (ref 19–32)
Chloride: 109 mEq/L (ref 96–112)
GFR calc Af Amer: >60 mL/min (ref >60)
Glucose, Bld: 84 mg/dL (ref 70–99)
Potassium: 3.5 mEq/L (ref 3.5–5.1)
Sodium: 141 mEq/L (ref 135–145)

## 2010-10-17 LAB — URINALYSIS, ROUTINE W REFLEX MICROSCOPIC
Bilirubin Urine: NEGATIVE
Nitrite: NEGATIVE
Protein, ur: NEGATIVE mg/dL
Specific Gravity, Urine: 1.019 (ref 1.005–1.030)
Urobilinogen, UA: 0.2 mg/dL (ref 0.0–1.0)

## 2010-10-17 LAB — LIPASE, BLOOD: Lipase: 18 U/L (ref 11–59)

## 2010-10-17 LAB — URINE MICROSCOPIC-ADD ON

## 2010-12-31 ENCOUNTER — Telehealth: Payer: Self-pay | Admitting: Family Medicine

## 2010-12-31 ENCOUNTER — Encounter (HOSPITAL_BASED_OUTPATIENT_CLINIC_OR_DEPARTMENT_OTHER): Payer: Self-pay | Admitting: *Deleted

## 2010-12-31 ENCOUNTER — Emergency Department (INDEPENDENT_AMBULATORY_CARE_PROVIDER_SITE_OTHER): Payer: Medicare Other

## 2010-12-31 ENCOUNTER — Emergency Department (HOSPITAL_BASED_OUTPATIENT_CLINIC_OR_DEPARTMENT_OTHER)
Admission: EM | Admit: 2010-12-31 | Discharge: 2010-12-31 | Disposition: A | Discharge status: Home / Self Care | Payer: Medicare Other | Attending: Emergency Medicine | Admitting: Emergency Medicine

## 2010-12-31 DIAGNOSIS — S42463A Displaced fracture of medial condyle of unspecified humerus, initial encounter for closed fracture: Secondary | ICD-10-CM | POA: Insufficient documentation

## 2010-12-31 DIAGNOSIS — S0993XA Unspecified injury of face, initial encounter: Secondary | ICD-10-CM

## 2010-12-31 DIAGNOSIS — S0990XA Unspecified injury of head, initial encounter: Secondary | ICD-10-CM | POA: Insufficient documentation

## 2010-12-31 DIAGNOSIS — Y92009 Unspecified place in unspecified non-institutional (private) residence as the place of occurrence of the external cause: Secondary | ICD-10-CM | POA: Insufficient documentation

## 2010-12-31 DIAGNOSIS — S0181XA Laceration without foreign body of other part of head, initial encounter: Secondary | ICD-10-CM

## 2010-12-31 DIAGNOSIS — W010XXA Fall on same level from slipping, tripping and stumbling without subsequent striking against object, initial encounter: Secondary | ICD-10-CM | POA: Insufficient documentation

## 2010-12-31 DIAGNOSIS — W19XXXA Unspecified fall, initial encounter: Secondary | ICD-10-CM

## 2010-12-31 DIAGNOSIS — E119 Type 2 diabetes mellitus without complications: Secondary | ICD-10-CM | POA: Insufficient documentation

## 2010-12-31 DIAGNOSIS — M25429 Effusion, unspecified elbow: Secondary | ICD-10-CM

## 2010-12-31 DIAGNOSIS — S199XXA Unspecified injury of neck, initial encounter: Secondary | ICD-10-CM

## 2010-12-31 DIAGNOSIS — S42309A Unspecified fracture of shaft of humerus, unspecified arm, initial encounter for closed fracture: Secondary | ICD-10-CM

## 2010-12-31 DIAGNOSIS — T148XXA Other injury of unspecified body region, initial encounter: Secondary | ICD-10-CM

## 2010-12-31 DIAGNOSIS — W1809XA Striking against other object with subsequent fall, initial encounter: Secondary | ICD-10-CM

## 2010-12-31 DIAGNOSIS — G319 Degenerative disease of nervous system, unspecified: Secondary | ICD-10-CM | POA: Insufficient documentation

## 2010-12-31 DIAGNOSIS — Z79899 Other long term (current) drug therapy: Secondary | ICD-10-CM | POA: Insufficient documentation

## 2010-12-31 DIAGNOSIS — S0083XA Contusion of other part of head, initial encounter: Secondary | ICD-10-CM

## 2010-12-31 DIAGNOSIS — S0180XA Unspecified open wound of other part of head, initial encounter: Secondary | ICD-10-CM

## 2010-12-31 DIAGNOSIS — S01119A Laceration without foreign body of unspecified eyelid and periocular area, initial encounter: Secondary | ICD-10-CM | POA: Insufficient documentation

## 2010-12-31 DIAGNOSIS — M5030 Other cervical disc degeneration, unspecified cervical region: Secondary | ICD-10-CM | POA: Insufficient documentation

## 2010-12-31 NOTE — ED Notes (Signed)
Pt sts "my foot got caught and I fell". Pt denies loc. Pt has laceration above right eye and abrasions to bilateral knees. Pt c/o right elbow pain.

## 2010-12-31 NOTE — Telephone Encounter (Signed)
Elease Hashimoto, Mr. Gilliand fell and has been taken [facility]. He has not been assigned a new doc, so I didn't know who to notify. A neighbor called in to report it. Also, their son, Fritz Cauthon, is their POA. The neighbor cannot get a hold of him, and I couldn't find a number for him. Not sure what to do at this point, but wanted it all documented. Thanks!

## 2010-12-31 NOTE — ED Notes (Signed)
I cleaned all wounds with normal saline and 4x4's . I then applied bacitracin to all wounds including both hands, chin, and eyebrow. I wrapped kerlix over 2x2;s for padding on hands. I then applied a posterior splint to patient's right elbow using fiberglass material, then kerlix over cotton padding and then secured with ace and tape. I also then applied XL arm sling and fit.

## 2010-12-31 NOTE — Telephone Encounter (Signed)
Spoke with pt's son Jillyn Hidden and he is aware that pt is at the [facility].

## 2010-12-31 NOTE — ED Provider Notes (Signed)
History     CSN: [id number] Arrival date & time: 12/31/2010  3:03 PM   First MD Initiated Contact with Patient 12/31/10 1549      Chief Complaint  Patient presents with  . Fall  . Facial Laceration  . Abrasion    (Consider location/radiation/quality/duration/timing/severity/associated sxs/prior treatment) HPI Comments: Pt states that he was walking to mailbox and he got his foot caught on something and he fell:pt denies loc or dizziness associated with fall  Patient is a 77 y.o. male presenting with fall. The history is provided by the patient. No language interpreter was used.  Fall The accident occurred 1 to 2 hours ago. The fall occurred while walking. He landed on concrete. The volume of blood lost was minimal. The point of impact was the head, left knee, left elbow and right knee. The pain is moderate. He was ambulatory at the scene. There was no entrapment after the fall. There was no drug use involved in the accident. There was no alcohol use involved in the accident. Pertinent negatives include no visual change, no fever, no vomiting, no headaches and no loss of consciousness. Treatment on scene includes a c-collar and a backboard. He has tried nothing for the symptoms.    Past Medical History  Diagnosis Date  . Hemorrhoids, internal   . Family hx of colon cancer   . Diverticular disease   . Hx of adenomatous colonic polyps   . Diabetes mellitus type II, controlled   . Urinary frequency     Past Surgical History  Procedure Date  . Appendectomy     History reviewed. No pertinent family history.  History  Substance Use Topics  . Smoking status: Never Smoker   . Smokeless tobacco: Never Used  . Alcohol Use: No      Review of Systems  Constitutional: Negative for fever.  Gastrointestinal: Negative for vomiting.  Neurological: Negative for loss of consciousness and headaches.  All other systems reviewed and are negative.    Allergies  Penicillins  Home  Medications   Current Outpatient Rx  Name Route Sig Dispense Refill  . ASPIRIN 81 MG PO TBEC Oral Take 81 mg by mouth daily.      Marland Kitchen GLIPIZIDE 5 MG PO TABS Oral Take 1 tablet (5 mg total) by mouth 2 (two) times daily before a meal. 30 tablet 5    BP 139/82  Pulse 82  Temp(Src) 98 F (36.7 C) (Oral)  Resp 18  SpO2 99%  Physical Exam  Nursing note and vitals reviewed. Constitutional: He is oriented to person, place, and time. He appears well-developed and well-nourished.  HENT:       Laceration to right eyebrow  Eyes: Conjunctivae and EOM are normal. Pupils are equal, round, and reactive to light.  Neck: Neck supple.  Cardiovascular: Normal rate and regular rhythm.   Pulmonary/Chest: Effort normal and breath sounds normal.  Musculoskeletal: Normal range of motion.       Pt tender in the medial right shoulder  Neurological: He is alert and oriented to person, place, and time.  Skin:       Pt has abrasions to bilateral knees and hands as swell as the right chin:pt has laceration to the right eyebrow  Psychiatric: He has a normal mood and affect.    ED Course  LACERATION REPAIR Date/Time: 12/31/2010 5:25 PM Performed by: Teressa Lower Authorized by: Teressa Lower Consent: Verbal consent obtained. Written consent not obtained. Risks and benefits: risks, benefits and alternatives were  discussed Consent given by: patient Patient understanding: patient states understanding of the procedure being performed Body area: head/neck Location details: right eyelid Laceration length: 6 cm Foreign bodies: no foreign bodies Anesthesia: local infiltration Local anesthetic: lidocaine 2% without epinephrine Irrigation solution: saline Irrigation method: syringe Amount of cleaning: standard Skin closure: 5-0 Prolene Number of sutures: 6 Technique: simple Approximation: close Approximation difficulty: simple Patient tolerance: Patient tolerated the procedure well with no  immediate complications.   (including critical care time)  Labs Reviewed - No data to display Dg Elbow Complete Right  12/31/2010  *RADIOLOGY REPORT*  Clinical Data: Larey Seat this morning with pain  RIGHT ELBOW - COMPLETE 3+ VIEW  Comparison: None.  Findings: On the slightly oblique lateral view, there does appear to be displacement of the anterior fat pad suggesting the presence of a right elbow joint effusion.  On two views there is a nondisplaced fracture of the distal right humerus particularly along the medial condyle extending posteriorly.  IMPRESSION:  1.  Nondisplaced fracture of the distal right humerus involving the medial condyle and extending posteriorly. 2.  Small right elbow joint effusion.  Original Report Authenticated By: Juline Patch, M.D.   Ct Head Wo Contrast  12/31/2010  *RADIOLOGY REPORT*  Clinical Data:  Tripped and fell striking head and face  CT HEAD WITHOUT CONTRAST CT CERVICAL SPINE WITHOUT CONTRAST  Technique:  Multidetector CT imaging of the head and cervical spine was performed following the standard protocol without intravenous contrast.  Multiplanar CT image reconstructions of the cervical spine were also generated.  Comparison:  None  CT HEAD  Findings: Generalized atrophy. Normal ventricular morphology. No midline shift or mass effect. Small vessel chronic ischemic changes of deep cerebral white matter. No intracranial hemorrhage, mass lesion, or acute infarction. Visualized paranasal sinuses and mastoid air cells clear. Bones demineralized. Right supraorbital frontal scalp hematoma.  IMPRESSION: Atrophy with small vessel chronic ischemic changes of deep cerebral white matter. No acute intracranial abnormalities.  CT CERVICAL SPINE  Findings: Diffuse osseous demineralization. Disc space narrowing C4-C5, C5-C6, C6-C7. Minimal retrolisthesis C6-C7 and C7-T1. Diffuse multilevel facet degenerative changes. Encroachment upon bilateral cervical neural foramina at C4-C5 and C5-C6  by uncovertebral spurs. Large well defined lucency within the odontoid, question cyst. Vertebral body heights maintained. No acute fracture, subluxation, or bone destruction. Visualized skull base intact. A number of small sialoliths noted within the parotid glands, primarily left.  IMPRESSION: Multilevel degenerative disc and facet disease changes of the cervical spine. Probable cyst within the odontoid process. No definite acute bony abnormalities.  Original Report Authenticated By: Lollie Marrow, M.D.   Ct Cervical Spine Wo Contrast  12/31/2010  *RADIOLOGY REPORT*  Clinical Data:  Tripped and fell striking head and face  CT HEAD WITHOUT CONTRAST CT CERVICAL SPINE WITHOUT CONTRAST  Technique:  Multidetector CT imaging of the head and cervical spine was performed following the standard protocol without intravenous contrast.  Multiplanar CT image reconstructions of the cervical spine were also generated.  Comparison:  None  CT HEAD  Findings: Generalized atrophy. Normal ventricular morphology. No midline shift or mass effect. Small vessel chronic ischemic changes of deep cerebral white matter. No intracranial hemorrhage, mass lesion, or acute infarction. Visualized paranasal sinuses and mastoid air cells clear. Bones demineralized. Right supraorbital frontal scalp hematoma.  IMPRESSION: Atrophy with small vessel chronic ischemic changes of deep cerebral white matter. No acute intracranial abnormalities.  CT CERVICAL SPINE  Findings: Diffuse osseous demineralization. Disc space narrowing C4-C5, C5-C6, C6-C7. Minimal retrolisthesis  C6-C7 and C7-T1. Diffuse multilevel facet degenerative changes. Encroachment upon bilateral cervical neural foramina at C4-C5 and C5-C6 by uncovertebral spurs. Large well defined lucency within the odontoid, question cyst. Vertebral body heights maintained. No acute fracture, subluxation, or bone destruction. Visualized skull base intact. A number of small sialoliths noted within the  parotid glands, primarily left.  IMPRESSION: Multilevel degenerative disc and facet disease changes of the cervical spine. Probable cyst within the odontoid process. No definite acute bony abnormalities.  Original Report Authenticated By: Lollie Marrow, M.D.     1. Humerus fracture   2. Abrasion   3. Facial laceration   4. Fall   5. Head injury       MDM  Pt splinted by the nursing staff:pt grand daughter was her and instructions were given to her     Medical screening examination/treatment/procedure(s) were performed by non-physician practitioner and as supervising physician I was immediately available for consultation/collaboration. Osvaldo Human, M.D.   Teressa Lower, NP 12/31/10 1859  Carleene Cooper III, MD 01/01/11 [phone]

## 2011-01-01 ENCOUNTER — Telehealth: Payer: Self-pay | Admitting: Family Medicine

## 2011-01-01 NOTE — Telephone Encounter (Signed)
Pts other son called and said that pt had fallen yesterday afternoon and went to [facility]. Pt rcvd stitches above rt eyelid and eyebrow. Pt is going to need to come in to get stitches removed in 5 days. Pt has not been assigned a new pcp. Pls advise.

## 2011-01-01 NOTE — Telephone Encounter (Signed)
Per Nurse [name] pt scheduled for 01/05/11 to have stitches removed.  Pt's son is aware and will receive a list of our [place] offices so that pt can establish with new pcp.

## 2011-01-05 ENCOUNTER — Encounter: Payer: Self-pay | Admitting: Family Medicine

## 2011-01-05 ENCOUNTER — Ambulatory Visit (INDEPENDENT_AMBULATORY_CARE_PROVIDER_SITE_OTHER): Payer: Medicare Other | Admitting: Family Medicine

## 2011-01-05 VITALS — BP 118/70 | HR 104 | Wt 170.0 lb

## 2011-01-05 DIAGNOSIS — Z4802 Encounter for removal of sutures: Secondary | ICD-10-CM

## 2011-01-05 NOTE — Progress Notes (Signed)
  Subjective:    Patient ID: Lawrence Coleman, male    DOB: May 15, 1915, 77 y.o.   MRN: [id number]  HPI A 77 year old white male, patient of Dr. Scotty Court in for suture removal. He had a fall 5 days ago has a laceration above his right eye. He also sustained a fractured elbow. He seen orthopedic for that. CT scans at the hospital were negative   Review of Systems  Eyes: Negative.   Neurological: Negative.   Hematological: Negative.        Objective:   Physical Exam  Cardiovascular: Normal rate and regular rhythm.   Pulmonary/Chest: Effort normal and breath sounds normal.  Skin: Skin is warm and dry.       5 sutures removed from the right eye brow. It is well approximated. No drainage or signs of infection. Over 8 days for tenderness to palpation.          Assessment & Plan:  Assessment: Suture removal   Plan: Sutures removed. Wound care injections given. Patient will return in one month for complete physical exam with me. Recheck when necessary sooner.

## 2011-01-05 NOTE — Patient Instructions (Signed)
Wound Care     Wound care helps prevent pain and infection.   You may need a tetanus shot if:  · You cannot remember when you had your last tetanus shot.   · You have never had a tetanus shot.   · The injury broke your skin.   If you need a tetanus shot and you choose not to have one, you may get tetanus. Sickness from tetanus can be serious.  HOME CARE   · Only take medicine as told by your doctor.   · Clean the wound daily with mild soap and water.   · Change any bandages (dressings) as told by your doctor.   · Put medicated cream and a bandage on the wound as told by your doctor.   · Change the bandage if it gets wet, dirty, or starts to smell.   · Take showers. Do not take baths, swim, or do anything that puts your wound under water.   · Rest and raise (elevate) the wound until the pain and puffiness (swelling) are better.   · Keep all doctor visits as told.   GET HELP RIGHT AWAY IF:   · Yellowish-white fluid (pus) comes from the wound.   · Medicine does not lessen your pain.   · There is a red streak going away from the wound.   · You cannot move your finger or toe.   · You have a fever.   MAKE SURE YOU:   · Understand these instructions.   · Will watch your condition.   · Will get help right away if you are not doing well or get worse.   Document Released: 10/08/2007 Document Revised: 09/10/2010 Document Reviewed: 05/04/2010  Exit[employer]® Patient Information ©2012 [employer].

## 2011-02-02 ENCOUNTER — Encounter: Payer: Self-pay | Admitting: Family

## 2011-02-02 ENCOUNTER — Ambulatory Visit (INDEPENDENT_AMBULATORY_CARE_PROVIDER_SITE_OTHER): Payer: Medicare Other | Admitting: Family

## 2011-02-02 VITALS — BP 120/82 | Temp 97.4°F | Wt 158.0 lb

## 2011-02-02 DIAGNOSIS — E119 Type 2 diabetes mellitus without complications: Secondary | ICD-10-CM

## 2011-02-02 DIAGNOSIS — Z23 Encounter for immunization: Secondary | ICD-10-CM

## 2011-02-02 DIAGNOSIS — E7800 Pure hypercholesterolemia, unspecified: Secondary | ICD-10-CM

## 2011-02-02 DIAGNOSIS — E039 Hypothyroidism, unspecified: Secondary | ICD-10-CM

## 2011-02-02 LAB — HEMOGLOBIN A1C: Hgb A1c MFr Bld: 6.4 % (ref 4.6–6.5)

## 2011-02-02 LAB — BASIC METABOLIC PANEL
Chloride: 104 mEq/L (ref 96–112)
Potassium: 4.4 mEq/L (ref 3.5–5.1)

## 2011-02-02 LAB — TSH: TSH: 0.74 u[IU]/mL (ref 0.35–5.50)

## 2011-02-02 MED ORDER — GLIPIZIDE 5 MG PO TABS: 5.0000 mg | ORAL_TABLET | Freq: Two times a day (BID) | ORAL | Status: DC

## 2011-02-02 NOTE — Progress Notes (Signed)
  Subjective:    Patient ID: Lawrence Coleman, male    DOB: 1915/01/13, 78 y.o.   MRN: [id number]  HPI Comments: Follow up, s/p fall one week after Christmas fracturing elbow and wore splint x 4 weeks. Currently seeing ortho. Rt mid arm tender to touch, unable to rate discomfort. Takes daily asa which relieves discomfort.   The patient has a history of hyperlipidemia, type 2 diabetes, and hypothyroidism. He is currently taking glipizide 5 mg twice daily. He tolerates the medication well. He admits to often forgetting doses. He does not routinely check his blood sugars. He denies any lightheadedness, dizziness, chest pain, palpitations, shortness of breath or edema. No changes in skin, hair or nails.   Review of Systems  Constitutional: Negative.   Eyes: Negative.   Respiratory: Negative.   Cardiovascular: Negative.   Musculoskeletal: Positive for arthralgias.  Neurological: Negative for dizziness, tremors, syncope, facial asymmetry, weakness, light-headedness and headaches.   Past Medical History  Diagnosis Date  . Hemorrhoids, internal   . Family hx of colon cancer   . Diverticular disease   . Hx of adenomatous colonic polyps   . Diabetes mellitus type II, controlled   . Urinary frequency     History   Social History  . Marital Status: Married    Spouse Name: N/A    Number of Children: N/A  . Years of Education: N/A   Occupational History  . Not on file.   Social History Main Topics  . Smoking status: Never Smoker   . Smokeless tobacco: Never Used  . Alcohol Use: No  . Drug Use: No  . Sexually Active: No   Other Topics Concern  . Not on file   Social History Narrative  . No narrative on file    Past Surgical History  Procedure Date  . Appendectomy     No family history on file.  Allergies  Allergen Reactions  . Penicillins     Current Outpatient Prescriptions on File Prior to Visit  Medication Sig Dispense Refill  . aspirin (BAYER LOW STRENGTH) 81 MG  EC tablet Take 81 mg by mouth daily.          BP 120/82  Temp(Src) 97.4 F (36.3 C) (Oral)  Wt 158 lb (71.668 kg)chart     Objective:   Physical Exam  Constitutional: He is oriented to person, place, and time. He appears well-developed.  Cardiovascular: Normal rate, regular rhythm, normal heart sounds and intact distal pulses.  Exam reveals no gallop and no friction rub.   No murmur heard. Pulmonary/Chest: Effort normal and breath sounds normal. No respiratory distress. He has no wheezes. He has no rales. He exhibits no tenderness.  Neurological: He is alert and oriented to person, place, and time.  Skin: Skin is warm and dry. No erythema.          Assessment & Plan:  Assessment: Routine follow up for s/p fall, type 2 diabetes, hypothyroidism, hyperlipidemia  Plan: Continue to follow up with orthopedic. Report any numbness and tingling or worsening of pain rt arm to MD. patient will return for complete physical exam in July as scheduled. Last in today to include BMP, A1c, TSH and CBC.

## 2011-02-02 NOTE — Patient Instructions (Signed)
Home Safety and Preventing Falls °Falls are a leading cause of injury and while they affect all age groups, falls have greater short-term and long-term impact on older age groups. However, falls should not be a part of life or aging. It is possible for individuals and their families to use preventive measures to significantly decrease the likelihood that anyone, especially an older adult, will fall. °There are many simple measures which can make your home safer with respect to preventing falls. The following actions can help reduce falls among all members of your family and are especially important as you age, when your balance, lower limb strength, coordination, and eyesight may be declining. The use of preventive measures will help to reduce you and your family's risk of falls and serious medical consequences. °OUTDOORS °· Repair cracks and edges of walkways and driveways.  °· Remove high doorway thresholds and trim shrubbery on the main path into your home.  °· Ensure there is good outside lighting at main entrances and along main walkways.  °· Clear walkways of tools, rocks, debris, and clutter.  °· Check that handrails are not broken and are securely fastened. Both sides of steps should have handrails.  °· In the garage, be attentive to and clean up grease or oil spills on the cement. This can make the surface extremely slippery.  °· In winter, have leaves, snow, and ice cleared regularly.  °· Use sand or salt on walkways during winter months.  °BATHROOM °· Install grab bars by the toilet and in the tub and shower.  °· Use non-skid mats or decals in the tub or shower.  °· If unable to easily stand unsupported while showering, place a plastic non slip stool in the shower to sit on when needed.  °· Install night lights.  °· Keep floors dry and clean up all water on the floor immediately.  °· Remove soap buildup in tub or shower on a regular basis.  °· Secure bath mats with non-slip, double-sided rug tape.   °· Remove tripping hazards from the floors.  °BEDROOMS °· Install night lights.  °· Do not use oversized bedding.  °· Make sure a bedside light is easy to reach.  °· Keep a telephone by your bedside.  °· Make sure that you can get in and out of your bed easily.  °· Have a firm chair, with side arms, to use for getting dressed.  °· Remove clutter from around closets.  °· Store clothing, bed coverings, and other household items where you can reach them comfortably.  °· Remove tripping hazards from the floor.  °LIVING AREAS AND STAIRWAYS °· Turn on lights to avoid having to walk through dark areas.  °· Keep lighting uniform in each room. Place brighter lightbulbs in darker areas, including stairways.  °· Replace lightbulbs that burn out in stairways immediately.  °· Arrange furniture to provide for clear pathways.  °· Keep furniture in the same place.  °· Eliminate or tape down electrical cables in high traffic areas.  °· Place handrails on both sides of stairways. Use handrails when going up or down stairs.  °· Most falls occur on the top or bottom 3 steps.  °· Fix any loose handrails. Make sure handrails on both sides of the stairways are as long as the stairs.  °· Remove all walkway obstacles.  °· Coil or tape electrical cords off to the side of walking areas and out of the way. If using many extension cords, have an electrician   put in a new wall outlet to reduce or eliminate them.  °· Make sure spills are cleaned up quickly and allow time for drying before walking on freshly cleaned floors.  °· Firmly attach carpet with non-skid or two-sided tape.  °· Keep frequently used items within easy reach.  °· Remove tripping hazards such as throw rugs and clutter in walkways. Never leave objects on stairs.  °· Get rid of throw rugs elsewhere if possible.  °· Eliminate uneven floor surfaces.  °· Make sure couches and chairs are easy to get into and out of.  °· Check carpeting to make sure it is firmly attached along stairs.   °· Make repairs to worn or loose carpet promptly.  °· Select a carpet pattern that does not visually hide the edge of steps.  °· Avoid placing throw rugs or scatter rugs at the top or bottom of stairways, or properly secure with carpet tape to prevent slippage.  °· Have an electrician put in a light switch at the top and bottom of the stairs.  °· Get light switches that glow.  °· Avoid the following practices: hurrying, inattention, obscured vision, carrying large loads, and wearing slip-on shoes.  °· Be aware of all pets.  °KITCHEN °· Place items that are used frequently, such as dishes and food, within easy reach.  °· Keep handles on pots and pans toward the center of the stove. Use back burners when possible.  °· Make sure spills are cleaned up quickly and allow time for drying.  °· Avoid walking on wet floors.  °· Avoid hot utensils and knives.  °· Position shelves so they are not too high or low.  °· Place commonly used objects within easy reach.  °· If necessary, use a sturdy step stool with a grab bar when reaching.  °· Make sure electrical cables are out of the way.  °· Do not use floor polish or wax that makes floors slippery.  °OTHER HOME FALL PREVENTION STRATEGIES °· Wear low heel or rubber sole shoes that are supportive and fit well.  °· Wear closed toe shoes.  °· Know and watch for side effects of medications. Have your caregiver or pharmacist look at all your medicines, even over-the-counter medicines. Some medicines can make you sleepy or dizzy.  °· Exercise regularly. Exercise makes you stronger and improves your balance and coordination.  °· Limit use of alcohol.  °· Use eyeglasses if necessary and keep them clean. Have your vision checked every year.  °· Organize your household in a manner that minimizes the need to walk distances when hurried, or go up and down stairs unnecessarily. For example, have a phone placed on at least each floor of your home. If possible, have a phone beside each sitting  or lying area where you spend the most time at home. Keep emergency numbers posted at all phones.  °· Use non-skid floor wax.  °· When using a ladder, make sure:  °· The base is firm.  °· All ladder feet are on level ground.  °· The ladder is angled against the wall properly.  °· When climbing a ladder, face the ladder and hold the ladder rungs firmly.  °· If reaching, always keep your hips and body weight centered between the rails.  °· When using a stepladder, make sure it is fully opened and both spreaders are firmly locked.  °· Do not climb a closed stepladder.  °· Avoid climbing beyond the second step from the top   of a stepladder and the 4th rung from the top of an extension ladder.  °· Learn and use mobility aids as needed.  °· Change positions slowly. Arise slowly from sitting and lying positions. Sit on the edge of your bed before getting to your feet.  °· If you have a history of falls, ask someone to add color or contrast paint or tape to grab bars and handrails in your home.  °· If you have a history of falls, ask someone to place contrasting color strips on first and last steps.  °· Install an electrical emergency response system if you need one, and know how to use it.  °· If you have a medical or other condition that causes you to have limited physical strength, it is important that you reach out to family and friends for occasional help.  °FOR CHILDREN: °· If young children are in the home, use safety gates. At the top of stairs use screw-mounted gates; use pressure-mounted gates for the bottom of the stairs and doorways between rooms.  °· Young children should be taught to descend stairs on their stomachs, feet first, and later using the handrail.  °· Keep drawers fully closed to prevent them from being climbed on or pulled out entirely.  °· Move chairs, cribs, beds and other furniture away from windows.  °· Consider installing window guards on windows ground floor and up, unless they are emergency  fire exits. Make sure they have easy release mechanisms.  °· Consider installing special locks that only allow the window to be opened to a certain height.  °· Never rely on window screens to prevent falls.  °· Never leave babies alone on changing tables, beds or sofas. Use a changing table that has a restraining strap.  °· When a child can pull to a standing position, the crib mattress should be adjusted to its lowest position. There should be at least 26 inches between the top rails of the crib drop side and the mattress. Toys, bumper pads, and other objects that can be used as steps to climb out should be removed from the crib.  °· On bunk beds never allow a child under age 6 to sleep on the top bunk. For older children, if the upper bunk is not against a wall, use guard rails on both sides. No matter how old a child is, keep the guard rails in place on the top bunk since children roll during sleep. Do not permit horseplay on bunks.  °· Grass and soil surfaces beneath backyard playground equipment should be replaced with hardwood chips, shredded wood mulch, sand, pea gravel, rubber, crushed stone, or another safer material at depths of at least 9 to 12 inches.  °· When riding bikes or using skates, skateboards, skis, or snowboards, require children to wear helmets. Look for those that have stickers stating that they meet or exceed safety standards.  °· Vertical posts or pickets in deck, balcony, and stairway railings should be no more than 3 1/2 inches apart if a young baby will have access to the area. The space between horizontal rails or bars, and between the floor and the first horizontal rail or bar, should be no more than 3 1/2 inches.  °Document Released: 12/19/2001 Document Revised: 09/10/2010 Document Reviewed: 10/18/2008 °[employer]® Patient Information ©2012 [employer]. °

## 2011-08-03 ENCOUNTER — Encounter: Payer: Self-pay | Admitting: Family

## 2011-08-03 ENCOUNTER — Ambulatory Visit (INDEPENDENT_AMBULATORY_CARE_PROVIDER_SITE_OTHER): Payer: Medicare Other | Admitting: Family

## 2011-08-03 VITALS — BP 124/80 | HR 100 | Temp 97.5°F | Ht 64.0 in | Wt 158.0 lb

## 2011-08-03 DIAGNOSIS — N400 Enlarged prostate without lower urinary tract symptoms: Secondary | ICD-10-CM

## 2011-08-03 DIAGNOSIS — E119 Type 2 diabetes mellitus without complications: Secondary | ICD-10-CM

## 2011-08-03 DIAGNOSIS — Z0000 Encounter for general adult medical examination without abnormal findings: Secondary | ICD-10-CM

## 2011-08-03 DIAGNOSIS — Z125 Encounter for screening for malignant neoplasm of prostate: Secondary | ICD-10-CM

## 2011-08-03 DIAGNOSIS — E7800 Pure hypercholesterolemia, unspecified: Secondary | ICD-10-CM

## 2011-08-03 DIAGNOSIS — N5089 Other specified disorders of the male genital organs: Secondary | ICD-10-CM

## 2011-08-03 DIAGNOSIS — N508 Other specified disorders of male genital organs: Secondary | ICD-10-CM

## 2011-08-03 DIAGNOSIS — E039 Hypothyroidism, unspecified: Secondary | ICD-10-CM

## 2011-08-03 LAB — CBC WITH DIFFERENTIAL/PLATELET
Basophils Relative: 0.9 % (ref 0.0–3.0)
Eosinophils Relative: 4 % (ref 0.0–5.0)
HCT: 44.9 % (ref 39.0–52.0)
MCV: 94.2 fl (ref 78.0–100.0)
Monocytes Absolute: 0.5 10*3/uL (ref 0.1–1.0)
Monocytes Relative: 8.2 % (ref 3.0–12.0)
Neutrophils Relative %: 70.9 % (ref 43.0–77.0)
RBC: 4.77 Mil/uL (ref 4.22–5.81)
WBC: 5.5 10*3/uL (ref 4.5–10.5)

## 2011-08-03 LAB — POCT URINALYSIS DIPSTICK
Bilirubin, UA: NEGATIVE
Blood, UA: NEGATIVE
Glucose, UA: NEGATIVE
Ketones, UA: NEGATIVE
Spec Grav, UA: 1.01
pH, UA: 5.5

## 2011-08-03 LAB — LIPID PANEL
Cholesterol: 153 mg/dL (ref 0–200)
HDL: 38.5 mg/dL — ABNORMAL LOW (ref >39.00)
LDL Cholesterol: 101 mg/dL — ABNORMAL HIGH (ref 0–99)
VLDL: 13.6 mg/dL (ref 0.0–40.0)

## 2011-08-03 LAB — BASIC METABOLIC PANEL
Chloride: 104 mEq/L (ref 96–112)
Creatinine, Ser: 1.1 mg/dL (ref 0.4–1.5)
GFR: 69.21 mL/min (ref >60.00)
Potassium: 4 mEq/L (ref 3.5–5.1)

## 2011-08-03 LAB — PSA: PSA: 0.62 ng/mL (ref 0.10–4.00)

## 2011-08-03 NOTE — Progress Notes (Signed)
Subjective:    Patient ID: Lawrence Coleman, male    DOB: Feb 05, 1915, 77 y.o.   MRN: [id number]  HPI Patient presents for yearly preventative medicine examination. All immunizations and health maintenance protocols were reviewed with the patient and they are up to date with these protocols. Is in with  Screening laboratory values were reviewed with the patient including screening of hyperlipidemia PSA renal function and hepatic function. There medications past medical history social history problem list and allergies were reviewed in detail.Goals were established with regard to weight loss exercise diet in compliance with medications.    Review of Systems  Constitutional: Negative.   HENT: Negative.   Eyes: Negative.   Respiratory: Negative.   Cardiovascular: Negative.   Gastrointestinal: Negative.   Genitourinary: Positive for frequency and scrotal swelling. Negative for urgency, discharge and penile pain.  Musculoskeletal: Negative.   Skin: Negative.   Neurological: Negative.   Hematological: Negative.   Psychiatric/Behavioral: Negative.    Past Medical History  Diagnosis Date  . Hemorrhoids, internal   . Family hx of colon cancer   . Diverticular disease   . Hx of adenomatous colonic polyps   . Diabetes mellitus type II, controlled   . Urinary frequency     History   Social History  . Marital Status: Married    Spouse Name: N/A    Number of Children: N/A  . Years of Education: N/A   Occupational History  . Not on file.   Social History Main Topics  . Smoking status: Never Smoker   . Smokeless tobacco: Never Used  . Alcohol Use: No  . Drug Use: No  . Sexually Active: No   Other Topics Concern  . Not on file   Social History Narrative  . No narrative on file    Past Surgical History  Procedure Date  . Appendectomy     No family history on file.  Allergies  Allergen Reactions  . Penicillins     Current Outpatient Prescriptions on File Prior to  Visit  Medication Sig Dispense Refill  . aspirin (BAYER LOW STRENGTH) 81 MG EC tablet Take 162 mg by mouth daily.       Marland Kitchen glipiZIDE (GLUCOTROL) 5 MG tablet Take 1 tablet (5 mg total) by mouth 2 (two) times daily before a meal.  30 tablet  5    BP 124/80  Pulse 100  Temp 97.5 F (36.4 C) (Oral)  Ht 5\' 4"  (1.626 m)  Wt 158 lb (71.668 kg)  BMI 27.12 kg/m2  SpO2 94%chart    Objective:   Physical Exam  Constitutional: He is oriented to person, place, and time. He appears well-developed and well-nourished.  HENT:  Head: Normocephalic and atraumatic.  Right Ear: External ear normal.  Left Ear: External ear normal.  Nose: Nose normal.  Eyes: Conjunctivae and EOM are normal. Pupils are equal, round, and reactive to light.  Neck: Normal range of motion. Neck supple. No thyromegaly present.  Cardiovascular: Normal rate, regular rhythm and normal heart sounds.   Pulmonary/Chest: Effort normal and breath sounds normal.  Abdominal: Soft. Bowel sounds are normal.  Genitourinary: Penis normal.       Prostate 2+ enlarged but firm, no nodules. Right testicle swollen, nontender.  Musculoskeletal: Normal range of motion.  Neurological: He is alert and oriented to person, place, and time. He has normal reflexes.  Skin: Skin is warm and dry.          Assessment & Plan:  Assessment: Complete  physical exam, hypothyroidism, hyperlipidemia, type 2 diabetes-controlled, testicular enlargement, prostate enlargement  Plan: Lab sent to include BMP, CBC, lipids, TSH, PSA notify patient pending results. We'll order an ultrasound of the right testicle to determine the etiology of the fullness of the testicle and swelling. We'll see patient back for routine check in 3 months.

## 2011-08-04 ENCOUNTER — Ambulatory Visit
Admission: RE | Admit: 2011-08-04 | Discharge: 2011-08-04 | Disposition: A | Discharge status: Home / Self Care | Payer: Medicare Other | Source: Ambulatory Visit | Attending: Family | Admitting: Family

## 2011-08-04 DIAGNOSIS — N5089 Other specified disorders of the male genital organs: Secondary | ICD-10-CM

## 2012-01-27 IMAGING — CR DG LUMBAR SPINE COMPLETE 4+V
5 series · 5 of 5 positions shown · non-contrast
Comparison: CT abdomen and pelvis 04/03/2009.

CLINICAL DATA: Back pain.

LUMBAR SPINE - COMPLETE 4+ VIEW

[t l-spine a.p.]
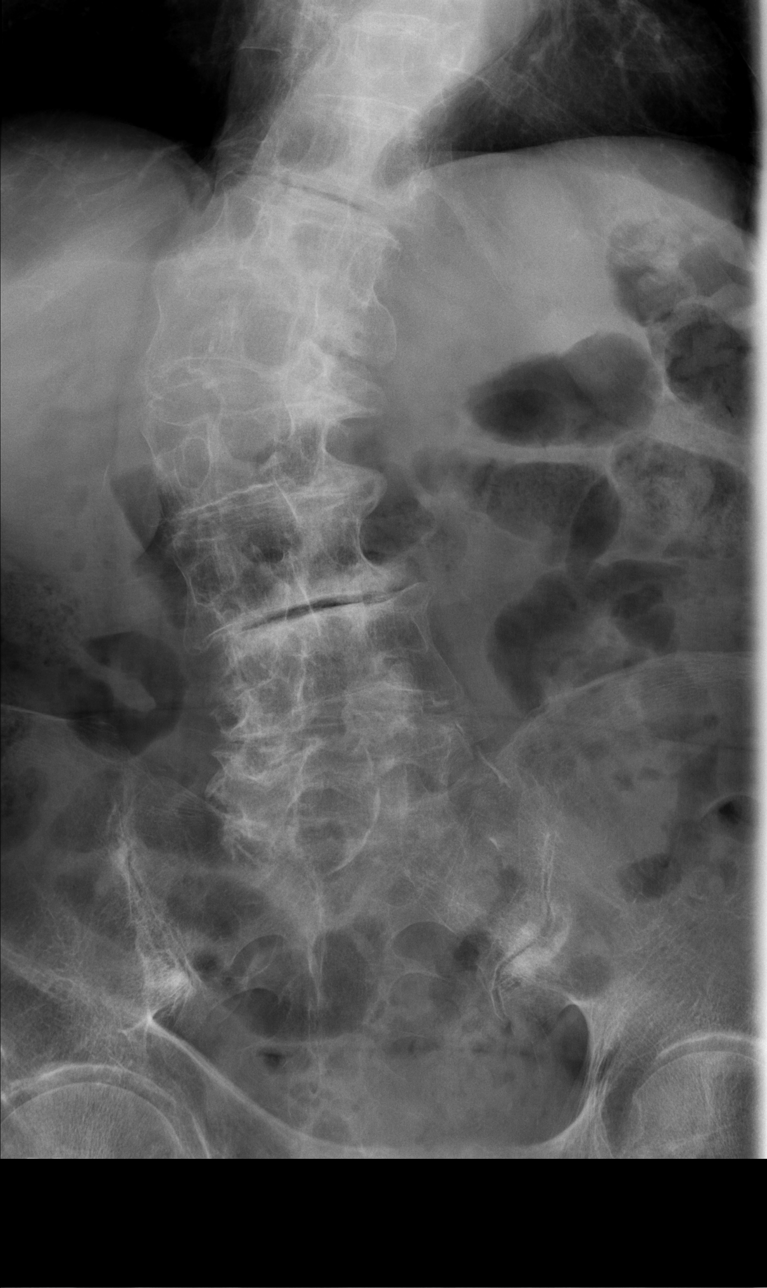

[t l-spine oblique exposure (1 of 2)]
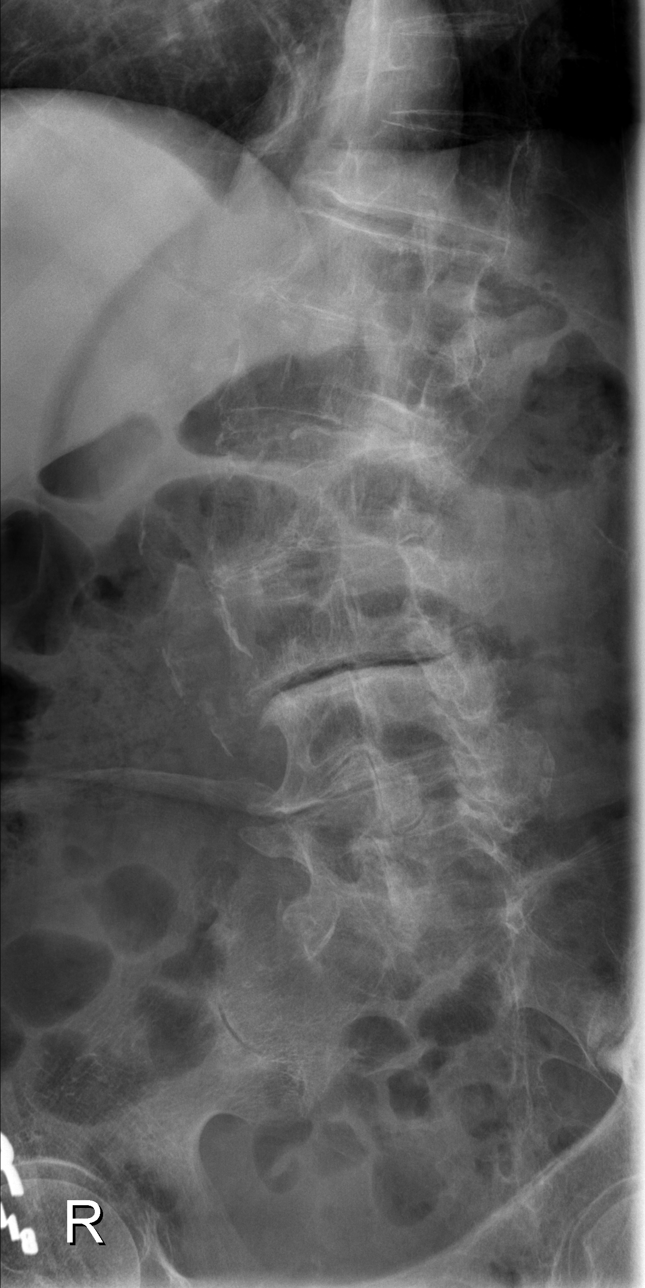

[t l-spine oblique exposure (2 of 2)]
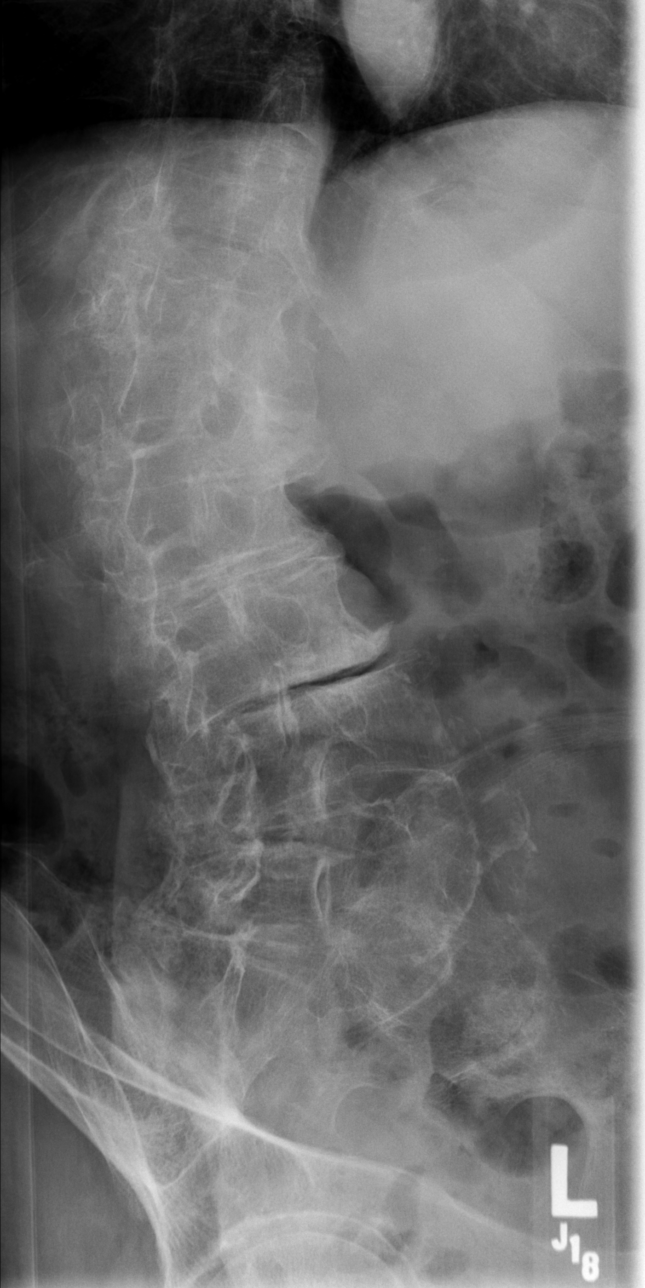

[t l-spine lat]
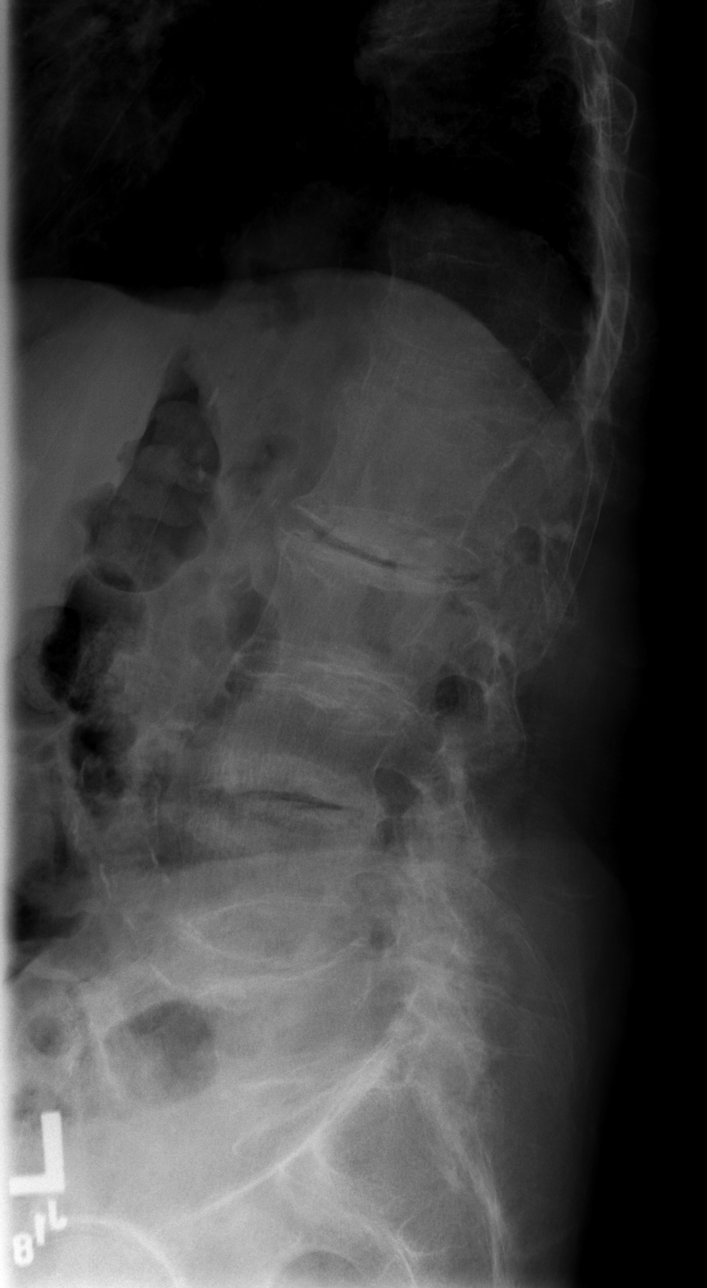

[t l-spine l5-s1 spot]
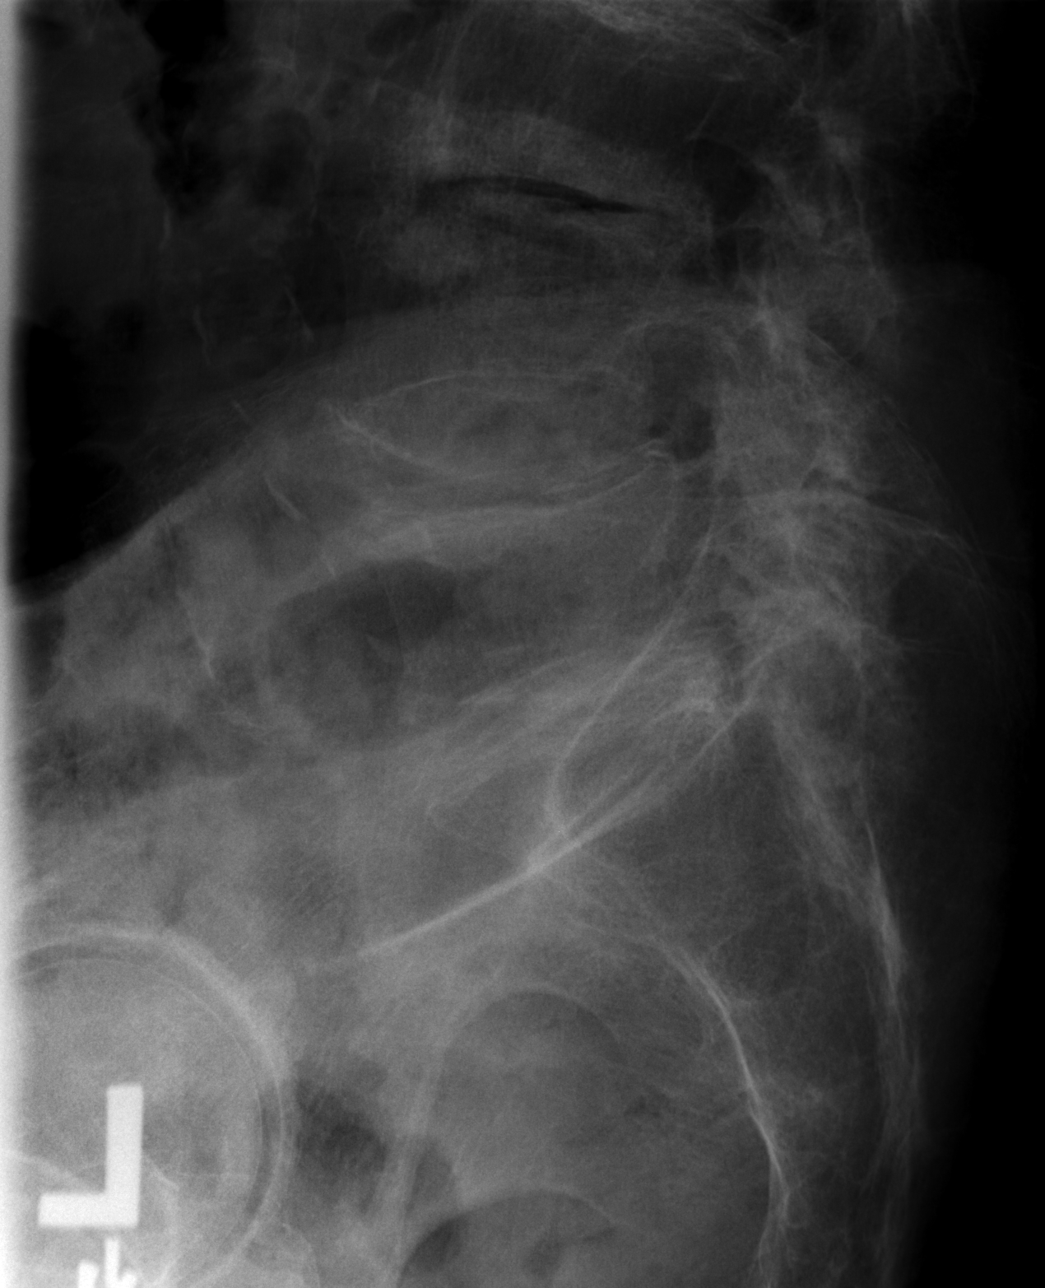

[5 of 5 positions shown; findings below may reference images not displayed]

FINDINGS: Severe convex right scoliosis is noted. No fracture or
subluxation. Severe multilevel degenerative disease.
IMPRESSION: No acute finding. Severe scoliosis and and spondylosis.

## 2012-11-24 ENCOUNTER — Telehealth: Payer: Self-pay | Admitting: Family

## 2012-11-24 MED ORDER — CVS BLOOD GLUCOSE METER W/DEVICE KIT: PACK | Status: DC

## 2012-11-24 NOTE — Telephone Encounter (Signed)
Pt needs new diabetic supplies, including a new meter (old one does not work). The co he was getting them from went [facility], and pt needs to get at [facility]/ [address]/ [place] Pt test twice x day  Strips, lancets,meter

## 2012-11-24 NOTE — Telephone Encounter (Signed)
Rx for meter, strips and lancets sent to pharmacy

## 2012-11-28 NOTE — Telephone Encounter (Signed)
Pt's son aware pt needs appointment

## 2012-11-28 NOTE — Telephone Encounter (Signed)
Per pt son Lawrence Coleman. Pt must have a written rxs to take to pharm for medicare to cover meter,strips and lancets. Please call son Lawrence at [phone]

## 2012-12-13 ENCOUNTER — Ambulatory Visit (INDEPENDENT_AMBULATORY_CARE_PROVIDER_SITE_OTHER): Payer: Medicare Other | Admitting: Family

## 2012-12-13 ENCOUNTER — Telehealth: Payer: Self-pay | Admitting: Family

## 2012-12-13 ENCOUNTER — Encounter: Payer: Self-pay | Admitting: Family

## 2012-12-13 VITALS — BP 126/76 | HR 65 | Ht 64.0 in | Wt 158.0 lb

## 2012-12-13 DIAGNOSIS — H6123 Impacted cerumen, bilateral: Secondary | ICD-10-CM

## 2012-12-13 DIAGNOSIS — Z0000 Encounter for general adult medical examination without abnormal findings: Secondary | ICD-10-CM

## 2012-12-13 DIAGNOSIS — H6120 Impacted cerumen, unspecified ear: Secondary | ICD-10-CM

## 2012-12-13 DIAGNOSIS — IMO0001 Reserved for inherently not codable concepts without codable children: Secondary | ICD-10-CM

## 2012-12-13 DIAGNOSIS — E1165 Type 2 diabetes mellitus with hyperglycemia: Secondary | ICD-10-CM

## 2012-12-13 DIAGNOSIS — IMO0002 Reserved for concepts with insufficient information to code with codable children: Secondary | ICD-10-CM

## 2012-12-13 DIAGNOSIS — E039 Hypothyroidism, unspecified: Secondary | ICD-10-CM

## 2012-12-13 DIAGNOSIS — E559 Vitamin D deficiency, unspecified: Secondary | ICD-10-CM

## 2012-12-13 LAB — CBC WITH DIFFERENTIAL/PLATELET
Basophils Relative: 1.1 % (ref 0.0–3.0)
Eosinophils Relative: 4 % (ref 0.0–5.0)
HCT: 44.3 % (ref 39.0–52.0)
MCV: 92 fl (ref 78.0–100.0)
Monocytes Relative: 10.4 % (ref 3.0–12.0)
Neutrophils Relative %: 65.3 % (ref 43.0–77.0)
Platelets: 110 10*3/uL — ABNORMAL LOW (ref 150.0–400.0)
RBC: 4.81 Mil/uL (ref 4.22–5.81)
WBC: 5.8 10*3/uL (ref 4.5–10.5)

## 2012-12-13 LAB — HEPATIC FUNCTION PANEL
Alkaline Phosphatase: 85 U/L (ref 39–117)
Bilirubin, Direct: 0.2 mg/dL (ref 0.0–0.3)
Total Protein: 6.8 g/dL (ref 6.0–8.3)

## 2012-12-13 LAB — BASIC METABOLIC PANEL
BUN: 23 mg/dL (ref 6–23)
CO2: 29 mEq/L (ref 19–32)
Chloride: 105 mEq/L (ref 96–112)
Creatinine, Ser: 1 mg/dL (ref 0.4–1.5)
Glucose, Bld: 148 mg/dL — ABNORMAL HIGH (ref 70–99)
Potassium: 4.3 mEq/L (ref 3.5–5.1)

## 2012-12-13 LAB — POCT URINALYSIS DIPSTICK
Glucose, UA: NEGATIVE
Ketones, UA: NEGATIVE
Leukocytes, UA: NEGATIVE
Protein, UA: NEGATIVE
Spec Grav, UA: 1.025
Urobilinogen, UA: 0.2

## 2012-12-13 LAB — LIPID PANEL
LDL Cholesterol: 113 mg/dL — ABNORMAL HIGH (ref 0–99)
Total CHOL/HDL Ratio: 4
VLDL: 14.4 mg/dL (ref 0.0–40.0)

## 2012-12-13 LAB — TSH: TSH: 0.94 u[IU]/mL (ref 0.35–5.50)

## 2012-12-13 MED ORDER — GLIPIZIDE 5 MG PO TABS: 5.0000 mg | ORAL_TABLET | Freq: Two times a day (BID) | ORAL | Status: DC

## 2012-12-13 MED ORDER — CVS BLOOD GLUCOSE METER W/DEVICE KIT: PACK | Status: DC

## 2012-12-13 NOTE — Telephone Encounter (Signed)
Pt was seen today and son is calling back because pt must have a written rx for glucose meter, strips and lancet. Pt son will pick up written rxs

## 2012-12-13 NOTE — Patient Instructions (Signed)

## 2012-12-13 NOTE — Progress Notes (Signed)
Subjective:    Patient ID: Lawrence Coleman, male    DOB: 05-30-1915, 79 y.o.   MRN: [id number]  HPI 79 year old WM, nonsmoker, is in today for a CPX. He has a history of Hypothyroidism, Vitamin D deficiency, Hyperlipidemia, BPH, type 2 diabetes.  All immunizations and health maintenance protocols were reviewed with the patient and they are up to date with these protocols. Screening laboratory values were reviewed with the patient including screening of hyperlipidemia PSA renal function and hepatic function. There medications past medical history social history problem list and allergies were reviewed in detail.   Review of Systems  Constitutional: Negative.   HENT: Positive for hearing loss. Negative for congestion, dental problem, ear discharge, nosebleeds, sinus pressure and sneezing.   Eyes: Negative.   Respiratory: Negative.   Cardiovascular: Negative.   Gastrointestinal: Negative.   Endocrine: Negative.   Genitourinary: Negative.   Musculoskeletal: Negative.   Skin: Negative.   Allergic/Immunologic: Negative.   Neurological: Negative.   Hematological: Negative.   Psychiatric/Behavioral: Negative.    Past Medical History  Diagnosis Date  . Hemorrhoids, internal   . Family hx of colon cancer   . Diverticular disease   . Hx of adenomatous colonic polyps   . Diabetes mellitus type II, controlled   . Urinary frequency     History   Social History  . Marital Status: Married    Spouse Name: N/A    Number of Children: N/A  . Years of Education: N/A   Occupational History  . Not on file.   Social History Main Topics  . Smoking status: Never Smoker   . Smokeless tobacco: Never Used  . Alcohol Use: No  . Drug Use: No  . Sexual Activity: No   Other Topics Concern  . Not on file   Social History Narrative  . No narrative on file    Past Surgical History  Procedure Laterality Date  . Appendectomy      History reviewed. No pertinent family history.  Allergies   Allergen Reactions  . Penicillins     Current Outpatient Prescriptions on File Prior to Visit  Medication Sig Dispense Refill  . aspirin (BAYER LOW STRENGTH) 81 MG EC tablet Take 162 mg by mouth daily.        No current facility-administered medications on file prior to visit.    BP 126/76  Pulse 65  Ht 5\' 4"  (1.626 m)  Wt 158 lb (71.668 kg)  BMI 27.11 kg/m2chart    Objective:   Physical Exam  Constitutional: He is oriented to person, place, and time. He appears well-developed and well-nourished.  HENT:  Head: Normocephalic and atraumatic.  Nose: Nose normal.  Mouth/Throat: Oropharynx is clear and moist.  Bilateral cerumen impaction  Eyes: Conjunctivae and EOM are normal. Pupils are equal, round, and reactive to light.  Neck: Normal range of motion. Neck supple.  Cardiovascular: Normal rate, regular rhythm and normal heart sounds.   Pulmonary/Chest: Effort normal and breath sounds normal.  Abdominal: Soft. Bowel sounds are normal. He exhibits no distension. There is no tenderness. There is no rebound.  Genitourinary: Rectum normal and penis normal. Guaiac negative stool. No penile tenderness.  1+ prostate enlargement but no masses or nodules  Musculoskeletal: Normal range of motion. He exhibits no edema and no tenderness.  Neurological: He is alert and oriented to person, place, and time. He has normal reflexes. No cranial nerve deficit. Coordination normal.  Skin: Skin is warm and dry.  Psychiatric:  He has a normal mood and affect.     Informed consent was obtained and peroxide gel was inserted into the ears bilaterally using the lavage kit the ears were lavaged until clean.Inspection with a cerumen spoon removed residual wax. Patient tolerated the procedure well.      Assessment & Plan:  Assessment: 1. Complete physical exam 2. Hypothyroidism 3. Type 2 diabetes 4. Vitamin D deficiency 5. Hyperlipidemia 6. BPH 7. Cerumen Impaction  Plan: Encouraged healthy diet,  exercise. Continue current medications. Labs sent. Will notify patient of results. Recheck in 4 months and sooner as needed.

## 2012-12-13 NOTE — Telephone Encounter (Signed)
Rx printed

## 2012-12-14 ENCOUNTER — Ambulatory Visit: Payer: Medicare Other

## 2012-12-14 DIAGNOSIS — R7309 Other abnormal glucose: Secondary | ICD-10-CM

## 2012-12-14 LAB — HEMOGLOBIN A1C: Hgb A1c MFr Bld: 6.8 % — ABNORMAL HIGH (ref 4.6–6.5)

## 2013-04-19 ENCOUNTER — Ambulatory Visit: Payer: Medicare Other | Admitting: Family

## 2013-10-26 DIAGNOSIS — I4891 Unspecified atrial fibrillation: Secondary | ICD-10-CM | POA: Diagnosis present

## 2013-10-26 DIAGNOSIS — Z800 Family history of malignant neoplasm of digestive organs: Secondary | ICD-10-CM

## 2013-10-26 DIAGNOSIS — Z23 Encounter for immunization: Secondary | ICD-10-CM

## 2013-10-26 DIAGNOSIS — I639 Cerebral infarction, unspecified: Principal | ICD-10-CM | POA: Diagnosis present

## 2013-10-26 DIAGNOSIS — Z7982 Long term (current) use of aspirin: Secondary | ICD-10-CM

## 2013-10-26 DIAGNOSIS — E039 Hypothyroidism, unspecified: Secondary | ICD-10-CM | POA: Diagnosis present

## 2013-10-26 DIAGNOSIS — R739 Hyperglycemia, unspecified: Secondary | ICD-10-CM | POA: Diagnosis not present

## 2013-10-26 DIAGNOSIS — E1165 Type 2 diabetes mellitus with hyperglycemia: Secondary | ICD-10-CM | POA: Diagnosis present

## 2013-10-26 DIAGNOSIS — I129 Hypertensive chronic kidney disease with stage 1 through stage 4 chronic kidney disease, or unspecified chronic kidney disease: Secondary | ICD-10-CM | POA: Diagnosis present

## 2013-10-26 DIAGNOSIS — E785 Hyperlipidemia, unspecified: Secondary | ICD-10-CM | POA: Diagnosis present

## 2013-10-26 DIAGNOSIS — Z880 Allergy status to penicillin: Secondary | ICD-10-CM

## 2013-10-26 DIAGNOSIS — R441 Visual hallucinations: Secondary | ICD-10-CM | POA: Diagnosis present

## 2013-10-26 DIAGNOSIS — N182 Chronic kidney disease, stage 2 (mild): Secondary | ICD-10-CM | POA: Diagnosis present

## 2013-10-26 DIAGNOSIS — F0390 Unspecified dementia without behavioral disturbance: Secondary | ICD-10-CM | POA: Diagnosis present

## 2013-10-26 DIAGNOSIS — D696 Thrombocytopenia, unspecified: Secondary | ICD-10-CM | POA: Diagnosis present

## 2013-10-27 ENCOUNTER — Encounter (HOSPITAL_COMMUNITY): Payer: Self-pay | Admitting: Emergency Medicine

## 2013-10-27 ENCOUNTER — Emergency Department (HOSPITAL_COMMUNITY): Payer: Medicare Other

## 2013-10-27 ENCOUNTER — Observation Stay (HOSPITAL_COMMUNITY): Payer: Medicare Other

## 2013-10-27 ENCOUNTER — Inpatient Hospital Stay (HOSPITAL_COMMUNITY)
Admission: EM | Admit: 2013-10-27 | Discharge: 2013-10-29 | DRG: 066 | Disposition: A | Discharge status: Home / Self Care | Payer: Medicare Other | Attending: Internal Medicine | Admitting: Internal Medicine

## 2013-10-27 DIAGNOSIS — E785 Hyperlipidemia, unspecified: Secondary | ICD-10-CM | POA: Diagnosis present

## 2013-10-27 DIAGNOSIS — I639 Cerebral infarction, unspecified: Secondary | ICD-10-CM | POA: Diagnosis present

## 2013-10-27 DIAGNOSIS — R441 Visual hallucinations: Secondary | ICD-10-CM

## 2013-10-27 DIAGNOSIS — R4182 Altered mental status, unspecified: Secondary | ICD-10-CM

## 2013-10-27 DIAGNOSIS — N182 Chronic kidney disease, stage 2 (mild): Secondary | ICD-10-CM | POA: Diagnosis present

## 2013-10-27 DIAGNOSIS — IMO0002 Reserved for concepts with insufficient information to code with codable children: Secondary | ICD-10-CM | POA: Diagnosis present

## 2013-10-27 DIAGNOSIS — E039 Hypothyroidism, unspecified: Secondary | ICD-10-CM | POA: Diagnosis present

## 2013-10-27 DIAGNOSIS — E1165 Type 2 diabetes mellitus with hyperglycemia: Secondary | ICD-10-CM

## 2013-10-27 DIAGNOSIS — Z8679 Personal history of other diseases of the circulatory system: Secondary | ICD-10-CM

## 2013-10-27 DIAGNOSIS — D696 Thrombocytopenia, unspecified: Secondary | ICD-10-CM | POA: Diagnosis present

## 2013-10-27 LAB — COMPREHENSIVE METABOLIC PANEL
ALBUMIN: 3.3 g/dL — AB (ref 3.5–5.2)
ALBUMIN: 3.3 g/dL — AB (ref 3.5–5.2)
ALT: 16 U/L (ref 0–53)
ALT: 16 U/L (ref 0–53)
ANION GAP: 10 (ref 5–15)
AST: 21 U/L (ref 0–37)
AST: 22 U/L (ref 0–37)
Alkaline Phosphatase: 97 U/L (ref 39–117)
Alkaline Phosphatase: 92 U/L (ref 39–117)
Anion gap: 10 (ref 5–15)
BUN: 20 mg/dL (ref 6–23)
BUN: 23 mg/dL (ref 6–23)
CALCIUM: 9.1 mg/dL (ref 8.4–10.5)
CALCIUM: 9.1 mg/dL (ref 8.4–10.5)
CO2: 27 mEq/L (ref 19–32)
CO2: 27 mEq/L (ref 19–32)
CREATININE: 0.94 mg/dL (ref 0.50–1.35)
CREATININE: 1.1 mg/dL (ref 0.50–1.35)
Chloride: 102 mEq/L (ref 96–112)
Chloride: 102 mEq/L (ref 96–112)
GFR calc Af Amer: 64 mL/min — ABNORMAL LOW (ref >90)
GFR calc Af Amer: 80 mL/min — ABNORMAL LOW (ref >90)
GFR calc non Af Amer: 69 mL/min — ABNORMAL LOW (ref >90)
GFR, EST NON AFRICAN AMERICAN: 55 mL/min — AB (ref >90)
Glucose, Bld: 225 mg/dL — ABNORMAL HIGH (ref 70–99)
Glucose, Bld: 164 mg/dL — ABNORMAL HIGH (ref 70–99)
Potassium: 4.3 mEq/L (ref 3.7–5.3)
Potassium: 4.2 mEq/L (ref 3.7–5.3)
Sodium: 139 mEq/L (ref 137–147)
Sodium: 139 mEq/L (ref 137–147)
TOTAL PROTEIN: 6.4 g/dL (ref 6.0–8.3)
Total Bilirubin: 0.6 mg/dL (ref 0.3–1.2)
Total Bilirubin: 0.8 mg/dL (ref 0.3–1.2)
Total Protein: 6.7 g/dL (ref 6.0–8.3)

## 2013-10-27 LAB — TSH: TSH: 1.66 u[IU]/mL (ref 0.350–4.500)

## 2013-10-27 LAB — RAPID URINE DRUG SCREEN, HOSP PERFORMED
Amphetamines: NOT DETECTED
BENZODIAZEPINES: NOT DETECTED
Barbiturates: NOT DETECTED
Cocaine: NOT DETECTED
OPIATES: NOT DETECTED
Tetrahydrocannabinol: NOT DETECTED

## 2013-10-27 LAB — CBC WITH DIFFERENTIAL/PLATELET
BASOS ABS: 0 10*3/uL (ref 0.0–0.1)
BASOS PCT: 1 % (ref 0–1)
Basophils Absolute: 0 K/uL (ref 0.0–0.1)
Basophils Relative: 1 % (ref 0–1)
EOS ABS: 0.1 10*3/uL (ref 0.0–0.7)
EOS PCT: 3 % (ref 0–5)
Eosinophils Absolute: 0.2 K/uL (ref 0.0–0.7)
Eosinophils Relative: 4 % (ref 0–5)
HCT: 39.8 % (ref 39.0–52.0)
HEMATOCRIT: 39.9 % (ref 39.0–52.0)
Hemoglobin: 13.4 g/dL (ref 13.0–17.0)
Hemoglobin: 13.3 g/dL (ref 13.0–17.0)
Lymphocytes Relative: 17 % (ref 12–46)
Lymphocytes Relative: 21 % (ref 12–46)
Lymphs Abs: 0.8 10*3/uL (ref 0.7–4.0)
Lymphs Abs: 0.9 K/uL (ref 0.7–4.0)
MCH: 29.5 pg (ref 26.0–34.0)
MCH: 29.9 pg (ref 26.0–34.0)
MCHC: 33.4 g/dL (ref 30.0–36.0)
MCHC: 33.6 g/dL (ref 30.0–36.0)
MCV: 88.2 fL (ref 78.0–100.0)
MCV: 89.1 fL (ref 78.0–100.0)
MONO ABS: 0.5 10*3/uL (ref 0.1–1.0)
Monocytes Absolute: 0.5 K/uL (ref 0.1–1.0)
Monocytes Relative: 12 % (ref 3–12)
Monocytes Relative: 11 % (ref 3–12)
Neutro Abs: 3.1 10*3/uL (ref 1.7–7.7)
Neutro Abs: 2.6 K/uL (ref 1.7–7.7)
Neutrophils Relative %: 67 % (ref 43–77)
Neutrophils Relative %: 63 % (ref 43–77)
Platelets: 116 10*3/uL — ABNORMAL LOW (ref 150–400)
Platelets: 100 K/uL — ABNORMAL LOW (ref 150–400)
RBC: 4.48 MIL/uL (ref 4.22–5.81)
RBC: 4.51 MIL/uL (ref 4.22–5.81)
RDW: 12.8 % (ref 11.5–15.5)
RDW: 13 % (ref 11.5–15.5)
WBC: 4.5 10*3/uL (ref 4.0–10.5)
WBC: 4.1 K/uL (ref 4.0–10.5)

## 2013-10-27 LAB — PROTIME-INR
INR: 1.36 (ref 0.00–1.49)
PROTHROMBIN TIME: 16.9 s — AB (ref 11.6–15.2)

## 2013-10-27 LAB — VITAMIN B12: Vitamin B-12: 524 pg/mL (ref 211–911)

## 2013-10-27 LAB — GLUCOSE, CAPILLARY
GLUCOSE-CAPILLARY: 151 mg/dL — AB (ref 70–99)
Glucose-Capillary: 135 mg/dL — ABNORMAL HIGH (ref 70–99)
Glucose-Capillary: 142 mg/dL — ABNORMAL HIGH (ref 70–99)
Glucose-Capillary: 148 mg/dL — ABNORMAL HIGH (ref 70–99)

## 2013-10-27 LAB — URINALYSIS, ROUTINE W REFLEX MICROSCOPIC
Bilirubin Urine: NEGATIVE
Hgb urine dipstick: NEGATIVE
Ketones, ur: NEGATIVE mg/dL
LEUKOCYTES UA: NEGATIVE
NITRITE: NEGATIVE
PH: 5 (ref 5.0–8.0)
PROTEIN: NEGATIVE mg/dL
Specific Gravity, Urine: 1.025 (ref 1.005–1.030)
Urobilinogen, UA: 0.2 mg/dL (ref 0.0–1.0)

## 2013-10-27 LAB — AMMONIA: Ammonia: 11 umol/L (ref 11–60)

## 2013-10-27 LAB — ETHANOL: Alcohol, Ethyl (B): <11 mg/dL (ref 0–11)

## 2013-10-27 LAB — CBG MONITORING, ED: GLUCOSE-CAPILLARY: 214 mg/dL — AB (ref 70–99)

## 2013-10-27 LAB — SYPHILIS: RPR W/REFLEX TO RPR TITER AND TREPONEMAL ANTIBODIES, TRADITIONAL SCREENING AND DIAGNOSIS ALGORITHM

## 2013-10-27 LAB — URINE MICROSCOPIC-ADD ON

## 2013-10-27 LAB — APTT: aPTT: 39 seconds — ABNORMAL HIGH (ref 24–37)

## 2013-10-27 MED ORDER — INSULIN ASPART 100 UNIT/ML ~~LOC~~ SOLN
0.0000 [IU] | Freq: Three times a day (TID) | SUBCUTANEOUS | Status: DC
  Administered 2013-10-27: 1 [IU] via SUBCUTANEOUS
  Administered 2013-10-27: 2 [IU] via SUBCUTANEOUS
  Administered 2013-10-28: 5 [IU] via SUBCUTANEOUS
  Administered 2013-10-28: 2 [IU] via SUBCUTANEOUS
  Administered 2013-10-29: 3 [IU] via SUBCUTANEOUS
  Administered 2013-10-29: 2 [IU] via SUBCUTANEOUS

## 2013-10-27 MED ORDER — ASPIRIN 325 MG PO TABS
325.0000 mg | ORAL_TABLET | Freq: Every day | ORAL | Status: DC
  Administered 2013-10-27 – 2013-10-29 (×3): 325 mg via ORAL
  Filled 2013-10-27 (×4): qty 1

## 2013-10-27 MED ORDER — SODIUM CHLORIDE 0.9 % IV SOLN: INTRAVENOUS | Status: DC

## 2013-10-27 MED ORDER — INFLUENZA VAC SPLIT QUAD 0.5 ML IM SUSY
0.5000 mL | PREFILLED_SYRINGE | INTRAMUSCULAR | Status: DC
  Filled 2013-10-27: qty 0.5

## 2013-10-27 MED ORDER — ENOXAPARIN SODIUM 40 MG/0.4ML ~~LOC~~ SOLN
40.0000 mg | SUBCUTANEOUS | Status: DC
  Administered 2013-10-27: 40 mg via SUBCUTANEOUS
  Filled 2013-10-27: qty 0.4

## 2013-10-27 MED ORDER — SENNOSIDES-DOCUSATE SODIUM 8.6-50 MG PO TABS
1.0000 | ORAL_TABLET | Freq: Every evening | ORAL | Status: DC | PRN
Start: 2013-10-27 — End: 2013-10-29

## 2013-10-27 MED ORDER — ASPIRIN 300 MG RE SUPP
300.0000 mg | Freq: Every day | RECTAL | Status: DC
  Filled 2013-10-27 (×2): qty 1

## 2013-10-27 MED ORDER — STROKE: EARLY STAGES OF RECOVERY BOOK
Freq: Once | Status: DC
  Filled 2013-10-27: qty 1

## 2013-10-27 NOTE — H&P (Signed)
[facility] History and Physical  Lawrence Coleman ZOX:[id number] DOB: February 17, 1915 DOA: 10/27/2013  Referring physician: ER physician. PCP: CAMPBELL, Sherran Needs, NP   Chief Complaint: Hallucinations.  History provided by patient's son.  HPI: Lawrence Coleman is a 80 y.o. male with history of diabetes mellitus, atrial fibrillation was found to be having visual hallucinations since last night 9 PM as witnessed by patient's son. Patient's son states that patient started complaining of insects. Patient has not been taking any of his medications for last 8-9 months and was only taking aspirin. Patient has history of atrial fibrillation and was felt not to be a good candidate for anticoagulation. In the ER patient was found to be nonfocal but still was having visual hallucinations. CT head was negative for anything acute. Patient is oriented to his name only but was able to recognize his son. On-call neurologist was consulted by the ER physician and at this time neurologist has recommended admission for MRI brain to rule out stroke. They will be seeing patient in consult. As per patient's son patient did not have any nausea vomiting abdominal pain diarrhea fever chills chest pain shortness of breath or any productive cough. Patient as per the patient's son has been having declining mental status over the last few months.  Review of Systems: As presented in the history of presenting illness, rest negative.  Past Medical History  Diagnosis Date  . Hemorrhoids, internal   . Family hx of colon cancer   . Diverticular disease   . Hx of adenomatous colonic polyps   . Diabetes mellitus type II, controlled   . Urinary frequency    Past Surgical History  Procedure Laterality Date  . Appendectomy     Social History:  reports that he has never smoked. He has never used smokeless tobacco. He reports that he does not drink alcohol or use illicit drugs. Where does patient live home. Can patient  participate in ADLs? No.  Allergies  Allergen Reactions  . Penicillins Other (See Comments)    unknown    Family History: History reviewed. No pertinent family history.    Prior to Admission medications   Medication Sig Start Date End Date Taking? Authorizing Provider  aspirin (BAYER LOW STRENGTH) 81 MG EC tablet Take 162 mg by mouth daily.    Yes Historical Provider, MD    Physical Exam: Filed Vitals:   10/27/13 0600 10/27/13 0615 10/27/13 0630 10/27/13 0650  BP: 112/52  109/66 152/70  Pulse: 52  55 68  Temp:  97.6 F (36.4 C)  98 F (36.7 C)  Resp: 18  16 15   Height:    5\' 10"  (1.778 m)  Weight:    67.586 kg (149 lb)  SpO2: 98%  100% 100%     General:  Well built and moderately nourished.  Eyes: Anicteric no pallor.  ENT: No discharge from the ears eyes nose mouth.  Neck: No mass felt.  Cardiovascular: S1-S2 heard.  Respiratory: No rhonchi or crepitations.  Abdomen: Soft nontender bowel sounds present. No guarding or rigidity.  Skin: No rash.  Musculoskeletal: No edema.  Psychiatric: Patient is oriented to his name only.  Neurologic: Alert awake oriented to his name only. Moves all extremities 5 x 5. No facial asymmetry. Tongue is midline. PERRLA positive.  Labs on Admission:  Basic Metabolic Panel:  Recent Labs Lab 10/27/13 0030  NA 139  K 4.3  CL 102  CO2 27  GLUCOSE 225*  BUN 23  CREATININE 1.10  CALCIUM 9.1   Liver Function Tests:  Recent Labs Lab 10/27/13 0030  AST 21  ALT 16  ALKPHOS 97  BILITOT 0.6  PROT 6.7  ALBUMIN 3.3*   No results found for this basename: LIPASE, AMYLASE,  in the last 168 hours No results found for this basename: AMMONIA,  in the last 168 hours CBC:  Recent Labs Lab 10/27/13 0030  WBC 4.5  NEUTROABS 3.1  HGB 13.4  HCT 39.9  MCV 89.1  PLT 116*   Cardiac Enzymes: No results found for this basename: CKTOTAL, CKMB, CKMBINDEX, TROPONINI,  in the last 168 hours  BNP (last 3 results) No results  found for this basename: PROBNP,  in the last 8760 hours CBG:  Recent Labs Lab 10/27/13 0021  GLUCAP 214*    Radiological Exams on Admission: Dg Chest 2 View  10/27/2013   CLINICAL DATA:  Hyperglycemia.  Hypertension.  Initial encounter.  EXAM: CHEST  2 VIEW  COMPARISON:  Of/ 26 as 2009  FINDINGS: Grossly unchanged enlarged cardiac silhouette and mediastinal contours with atherosclerotic plaque within a tortuous and at least ectatic thoracic aorta. Improved aeration lungs. No focal airspace opacities. No pleural effusion or pneumothorax. No evidence of edema. Old fractures involving the right lateral ribs.  IMPRESSION: 1.  No acute cardiopulmonary disease. 2. Similar findings of cardiomegaly and atherosclerotic plaque within a tortuous and (at least) ectatic thoracic aorta.   Electronically Signed   By: Simonne ComeJohn  Watts M.D.   On: 10/27/2013 01:58   Ct Head Wo Contrast  10/27/2013   CLINICAL DATA:  Altered mental status. Elevated blood sugar. Visual hallucinations. Initial encounter.  EXAM: CT HEAD WITHOUT CONTRAST  TECHNIQUE: Contiguous axial images were obtained from the base of the skull through the vertex without intravenous contrast.  COMPARISON:  12/31/2010  FINDINGS: Re- demonstrated advanced atrophy with sulcal prominence and centralized volume loss with commensurate ex vacuo dilatation of the ventricular system, most conspicuously involving the occipital horn of the right lateral ventricle. Extensive periventricular hypodensities compatible with microvascular ischemic disease. Given extensive background parenchymal abnormalities, there is no CT evidence of acute large territory infarct. No intraparenchymal or extra-axial mass or hemorrhage. Unchanged size and configuration of the ventricles and basilar cisterns. No midline shift. Intracranial atherosclerosis. Limited visualization of the paranasal sinuses and mastoid air cells are normal. Regional soft tissues are normal. No displaced calvarial  fracture. Dental wires are noted about the maxilla.  IMPRESSION: Advanced atrophy and microvascular ischemic disease without acute intracranial process.   Electronically Signed   By: Simonne ComeJohn  Watts M.D.   On: 10/27/2013 02:12     Assessment/Plan Principal Problem:   Visual hallucination Active Problems:   Thrombocytopenia   Diabetes mellitus type 2, uncontrolled   History of atrial fibrillation   1. Acute onset of visual hallucination - primarily admitted for observation to rule out stroke. MRI brain has been ordered. In addition because patient increasingly confused ammonia levels B12 TSH and RPR has been ordered as part of possible dementia workup. Aspirin. Patient will be placed on neurochecks and swallow evaluation. Neurologist to see patient in consult. EKG is pending but monitor showing atrial fibrillation. 2. Diabetes mellitus type 2 - for now have placed patient on sliding-scale coverage. Check hemoglobin A1c. 3. Atrial fibrillation - closely follow rate in the monitor. Patient's PCP felt patient may not be a good candidate for anticoagulation. Patient is on aspirin.    Code Status: Full code.  Family Communication: Patient's son at the bedside.  Disposition  Plan: Admit for observation.    Leetta Hendriks N. [facility] Pager [phone]. If 7PM-7AM, please contact night-coverage [email or web address] Password [email or web address] 10/27/2013, 7:08 AM

## 2013-10-27 NOTE — Progress Notes (Signed)
   Follow Up Note  Pt admitted earlier this morning.  Seen after arrived to floor.  Alert and oriented x2, no acute distress, however visual hallucination of insects is still persisting  Exam: CV: Regular rate and rhythm, S1-S2 Lungs: Clear auscultation bilaterally Abd: Soft, nontender, nondistended, positive bowel sounds Ext: No focal deficits, no clubbing or cyanosis or edema  Present on Admission:  . Visual hallucination: Felt to be secondary to acute CVA, see below  . Diabetes mellitus type 2, uncontrolled: Stable. CBGs controlled  . Thrombocytopenia . Hypothyroidism: Stable. Continue on Synthroid. TSH is within normal limits  . Acute CVA (cerebrovascular accident):  Workup for hallucinations unrevealing except patient found to have a acute infarct in the right sensory cortex parietal area. Workup now in place including echocardiogram and carotid Dopplers pending as well as lipid panel and A1c. Discussed with son at the bedside

## 2013-10-27 NOTE — ED Provider Notes (Signed)
CSN: [id number]     Arrival date & time 10/26/13  2350 History   First MD Initiated Contact with Patient 10/27/13 0022     Chief Complaint  Patient presents with  . Hyperglycemia     (Consider location/radiation/quality/duration/timing/severity/associated sxs/prior Treatment) Patient is a 80 y.o. male presenting with neurologic complaint.  Neurologic Problem This is a new problem. The current episode started 6 to 12 hours ago. The problem occurs constantly. The problem has not changed since onset.Pertinent negatives include no chest pain, no abdominal pain, no headaches and no shortness of breath. Nothing aggravates the symptoms. Nothing relieves the symptoms. He has tried nothing for the symptoms.   Pt developed new onset visual hallucination of ants beginning last night at 8 PM.  No ho similar.  No recent fevers, cough, vomiting, or diarrhea. Past Medical History  Diagnosis Date  . Hemorrhoids, internal   . Family hx of colon cancer   . Diverticular disease   . Hx of adenomatous colonic polyps   . Diabetes mellitus type II, controlled   . Urinary frequency    Past Surgical History  Procedure Laterality Date  . Appendectomy     History reviewed. No pertinent family history. History  Substance Use Topics  . Smoking status: Never Smoker   . Smokeless tobacco: Never Used  . Alcohol Use: No    Review of Systems  Respiratory: Negative for shortness of breath.   Cardiovascular: Negative for chest pain.  Gastrointestinal: Negative for abdominal pain.  Neurological: Negative for headaches.  All other systems reviewed and are negative.     Allergies  Penicillins  Home Medications   Prior to Admission medications   Medication Sig Start Date End Date Taking? Authorizing Provider  aspirin (BAYER LOW STRENGTH) 81 MG EC tablet Take 162 mg by mouth daily.    Yes Historical Provider, MD   BP 152/70  Pulse 68  Temp(Src) 98 F (36.7 C)  Resp 15  Ht 5\' 10"  (1.778 m)  Wt  149 lb (67.586 kg)  BMI 21.38 kg/m2  SpO2 100% Physical Exam  Vitals reviewed. Constitutional: He is oriented to person, place, and time. He appears well-developed and well-nourished.  HENT:  Head: Normocephalic and atraumatic.  Eyes: Conjunctivae and EOM are normal.  Neck: Normal range of motion. Neck supple.  Cardiovascular: Normal rate, regular rhythm and normal heart sounds.   Pulmonary/Chest: Effort normal and breath sounds normal. No respiratory distress.  Abdominal: He exhibits no distension. There is no tenderness. There is no rebound and no guarding.  Musculoskeletal: Normal range of motion.  Neurological: He is alert and oriented to person, place, and time. He has normal strength and normal reflexes. No cranial nerve deficit or sensory deficit. GCS eye subscore is 4. GCS verbal subscore is 5. GCS motor subscore is 6.  Skin: Skin is warm and dry.    ED Course  Procedures (including critical care time) Labs Review Labs Reviewed  CBC WITH DIFFERENTIAL - Abnormal; Notable for the following:    Platelets 116 (*)    All other components within normal limits  COMPREHENSIVE METABOLIC PANEL - Abnormal; Notable for the following:    Glucose, Bld 225 (*)    Albumin 3.3 (*)    GFR calc non Af Amer 55 (*)    GFR calc Af Amer 64 (*)    All other components within normal limits  URINALYSIS, ROUTINE W REFLEX MICROSCOPIC - Abnormal; Notable for the following:    Glucose, UA >1000 (*)  All other components within normal limits  CBG MONITORING, ED - Abnormal; Notable for the following:    Glucose-Capillary 214 (*)    All other components within normal limits  URINE MICROSCOPIC-ADD ON  URINE RAPID DRUG SCREEN (HOSP PERFORMED)  ETHANOL  COMPREHENSIVE METABOLIC PANEL  CBC WITH DIFFERENTIAL  TSH  AMMONIA  VITAMIN B12  RPR  CBC    Imaging Review Dg Chest 2 View  10/27/2013   CLINICAL DATA:  Hyperglycemia.  Hypertension.  Initial encounter.  EXAM: CHEST  2 VIEW  COMPARISON:   Of/ 26 as 2009  FINDINGS: Grossly unchanged enlarged cardiac silhouette and mediastinal contours with atherosclerotic plaque within a tortuous and at least ectatic thoracic aorta. Improved aeration lungs. No focal airspace opacities. No pleural effusion or pneumothorax. No evidence of edema. Old fractures involving the right lateral ribs.  IMPRESSION: 1.  No acute cardiopulmonary disease. 2. Similar findings of cardiomegaly and atherosclerotic plaque within a tortuous and (at least) ectatic thoracic aorta.   Electronically Signed   By: Simonne ComeJohn  Watts M.D.   On: 10/27/2013 01:58   Ct Head Wo Contrast  10/27/2013   CLINICAL DATA:  Altered mental status. Elevated blood sugar. Visual hallucinations. Initial encounter.  EXAM: CT HEAD WITHOUT CONTRAST  TECHNIQUE: Contiguous axial images were obtained from the base of the skull through the vertex without intravenous contrast.  COMPARISON:  12/31/2010  FINDINGS: Re- demonstrated advanced atrophy with sulcal prominence and centralized volume loss with commensurate ex vacuo dilatation of the ventricular system, most conspicuously involving the occipital horn of the right lateral ventricle. Extensive periventricular hypodensities compatible with microvascular ischemic disease. Given extensive background parenchymal abnormalities, there is no CT evidence of acute large territory infarct. No intraparenchymal or extra-axial mass or hemorrhage. Unchanged size and configuration of the ventricles and basilar cisterns. No midline shift. Intracranial atherosclerosis. Limited visualization of the paranasal sinuses and mastoid air cells are normal. Regional soft tissues are normal. No displaced calvarial fracture. Dental wires are noted about the maxilla.  IMPRESSION: Advanced atrophy and microvascular ischemic disease without acute intracranial process.   Electronically Signed   By: Simonne ComeJohn  Watts M.D.   On: 10/27/2013 02:12     EKG Interpretation None      MDM   Final  diagnoses:  Visual hallucination  Diabetes mellitus type 2, uncontrolled  History of atrial fibrillation    80 y.o. male with pertinent PMH of DM presents with concern for hyperglycemia D2 new onset visual hallucinations. The patient states that he sees ants everywhere. He does not seem particularly perturbed. On discussion with family the patient has never had similar symptoms in the past.  On arrival today vitals signs and physical exam as above. No focal neurodeficits. Infectious workup unremarkable. Consult hospitalist for admission and MRI. Also consulted neurology.    1. Visual hallucination   2. Diabetes mellitus type 2, uncontrolled   3. History of atrial fibrillation         Mirian MoMatthew Jacqulynn Shappell, MD 10/27/13 [phone]

## 2013-10-27 NOTE — Significant Event (Addendum)
Rapid Response Event Note Pt admitted today for AMS and stroke work up. MRI positive for infarction. Upon evening assessment pt presented different from day shift report. RRT asked to see pt.  Overview: Time Called: 2135 Arrival Time: 2138 Event Type: Neurologic  Initial Focused Assessment: Pt found resting in bed eyes closed. Opens eyes to loud voice, slightly garbled speech. Difficult to arouse, uses extremities to push RN away.Moves all four extremeties with full equal strength. Pt states "Leave me alone" When pt name asked pt replies "I don't have a name!" VSS.  Interventions: Dr. Wyatt PortelaSomner paged to bedside.Per MD pt is less alert and different from the time of admision, however he is also unsure if this is not dementia/sundowning or something else. CT scan ordered to rule out further neuro concerns. CT to be done shortly  Event Summary: Name of Physician Notified: Dr. Jaquita FoldsP Somner at 2137    at    Outcome: Stayed in room and stabalized  Event End Time: 2150  Lolita RiegerWhite, Rayon Mcchristian Leigh Bobbye Petti

## 2013-10-27 NOTE — Consult Note (Signed)
Consult Reason for Consult:visual hallucinations Referring Physician: [facility]  CC: I see bugs on the walls  HPI: Lawrence MattesRobert G Coleman is an 80 y.o. male with past medical hx of DM, dementia presenting for new onset visual hallucinations. Majority of history provided via patients son. Patient was in normal state of health until around 9pm last night when he began to have visual hallucinations of bugs all over the walls and floor. Son checked his blood sugar at that time and it was 403. The son reports he typically is never higher than 200. No associated symptoms. No facial weakness. No focal motor or sensory changes. No gait instability. No change in mental status. No recent fevers or illnesses. No head trauma. Patient reports that he continues to see bugs on the ED walls. Otherwise is conversant and denies any acute concerns.   Son notes a progressive decline in his fathers cognitive function over the past 4 to 5 months. Denies any prior history of hallucinations. No recent medication changes. No EtOH usage.   Past Medical History  Diagnosis Date  . Hemorrhoids, internal   . Family hx of colon cancer   . Diverticular disease   . Hx of adenomatous colonic polyps   . Diabetes mellitus type II, controlled   . Urinary frequency     Past Surgical History  Procedure Laterality Date  . Appendectomy      No family history on file.  Social History:  reports that he has never smoked. He has never used smokeless tobacco. He reports that he does not drink alcohol or use illicit drugs.  Allergies  Allergen Reactions  . Penicillins Other (See Comments)    unknown    Medications: Scheduled:  ROS: Out of a complete 14 system review, the patient complains of only the following symptoms, and all other reviewed systems are negative. + hallucinations  Physical Examination: Blood pressure 109/66, pulse 55, temperature 97.6 F (36.4 C), resp. rate 16, SpO2 100.00%.  Neurologic  Examination Mental Status: Alert, oriented to name, [facility]" [facility]. Can tell me his sons name, home address, unsure of sons birthday. Follows 1 and 2 step commands with frequent prompts. Speech fluent, no dysarthria Cranial Nerves: II: unable to visualize fundi due to pupil size, visual fields grossly normal, pupils equal, round, reactive to light and accommodation III,IV, VI: ptosis not present, extra-ocular motions intact bilaterally V,VII: smile symmetric, facial light touch sensation normal bilaterally VIII: hearing normal bilaterally IXI: trapezius strength/neck flexion strength normal bilaterally XII: tongue strength normal  Motor: Right : Upper extremity    Left:     Upper extremity 5/5 deltoid       5/5 deltoid 5/5 biceps      5/5 biceps  5/5 triceps      5/5 triceps 5/5wrist flexion     5/5 wrist flexion 5/5 wrist extension     5/5 wrist extension 5/5 hand grip      5/5 hand grip  Lower extremity     Lower extremity 5/5 hip flexor      5/5 hip flexor 5/5 quadricep      5/5 quadriceps  5/5 hamstrings     5/5 hamstrings 5/5 plantar flexion       5/5 plantar flexion 5/5 plantar extension     5/5 plantar extension Tone and bulk:normal tone throughout; no atrophy noted Sensory: Pinprick and light touch intact throughout, bilaterally Deep Tendon Reflexes: 1+ and symmetric throughout Plantars: Right: downgoing   Left:  downgoing Cerebellar: normal finger-to-nose, normal rapid alternating movements and normal heel-to-shin test Gait: deferred gait due to multiple leads in ED  Laboratory Studies:   Basic Metabolic Panel:  Recent Labs Lab 10/27/13 0030  NA 139  K 4.3  CL 102  CO2 27  GLUCOSE 225*  BUN 23  CREATININE 1.10  CALCIUM 9.1    Liver Function Tests:  Recent Labs Lab 10/27/13 0030  AST 21  ALT 16  ALKPHOS 97  BILITOT 0.6  PROT 6.7  ALBUMIN 3.3*   No results found for this basename: LIPASE, AMYLASE,  in the last 168 hours No  results found for this basename: AMMONIA,  in the last 168 hours  CBC:  Recent Labs Lab 10/27/13 0030  WBC 4.5  NEUTROABS 3.1  HGB 13.4  HCT 39.9  MCV 89.1  PLT 116*    Cardiac Enzymes: No results found for this basename: CKTOTAL, CKMB, CKMBINDEX, TROPONINI,  in the last 168 hours  BNP: No components found with this basename: POCBNP,   CBG:  Recent Labs Lab 10/27/13 0021  GLUCAP 214*    Microbiology: Results for orders placed during the hospital encounter of 12/13/09  URINE CULTURE     Status: None   Collection Time    12/13/09 11:09 PM      Result Value Ref Range Status   Specimen Description URINE, RANDOM   Final   Special Requests NONE   Final   Culture  Setup Time [id number]   Final   Colony Count >=100,000 COLONIES/ML   Final   Culture ESCHERICHIA COLI   Final   Report Status 12/16/2009 FINAL   Final   Organism ID, Bacteria ESCHERICHIA COLI   Final    Coagulation Studies: No results found for this basename: LABPROT, INR,  in the last 72 hours  Urinalysis:  Recent Labs Lab 10/27/13 0329  COLORURINE YELLOW  LABSPEC 1.025  PHURINE 5.0  GLUCOSEU >1000*  HGBUR NEGATIVE  BILIRUBINUR NEGATIVE  KETONESUR NEGATIVE  PROTEINUR NEGATIVE  UROBILINOGEN 0.2  NITRITE NEGATIVE  LEUKOCYTESUR NEGATIVE    Lipid Panel:     Component Value Date/Time   CHOL 171 12/13/2012 1110   TRIG 72.0 12/13/2012 1110   HDL 43.60 12/13/2012 1110   CHOLHDL 4 12/13/2012 1110   VLDL 14.4 12/13/2012 1110   LDLCALC 113* 12/13/2012 1110    HgbA1C:  Lab Results  Component Value Date   HGBA1C 6.8* 12/14/2012    Urine Drug Screen:   No results found for this basename: labopia, cocainscrnur, labbenz, amphetmu, thcu, labbarb    Alcohol Level:  Recent Labs Lab 10/27/13 0610  [facility] <11   Imaging: Dg Chest 2 View  10/27/2013   CLINICAL DATA:  Hyperglycemia.  Hypertension.  Initial encounter.  EXAM: CHEST  2 VIEW  COMPARISON:  Of/ 26 as 2009  FINDINGS: Grossly unchanged  enlarged cardiac silhouette and mediastinal contours with atherosclerotic plaque within a tortuous and at least ectatic thoracic aorta. Improved aeration lungs. No focal airspace opacities. No pleural effusion or pneumothorax. No evidence of edema. Old fractures involving the right lateral ribs.  IMPRESSION: 1.  No acute cardiopulmonary disease. 2. Similar findings of cardiomegaly and atherosclerotic plaque within a tortuous and (at least) ectatic thoracic aorta.   Electronically Signed   By: Simonne Come M.D.   On: 10/27/2013 01:58   Ct Head Wo Contrast  10/27/2013   CLINICAL DATA:  Altered mental status. Elevated blood sugar. Visual hallucinations. Initial encounter.  EXAM: CT HEAD WITHOUT CONTRAST  TECHNIQUE: Contiguous axial images were obtained from the base of the skull through the vertex without intravenous contrast.  COMPARISON:  12/31/2010  FINDINGS: Re- demonstrated advanced atrophy with sulcal prominence and centralized volume loss with commensurate ex vacuo dilatation of the ventricular system, most conspicuously involving the occipital horn of the right lateral ventricle. Extensive periventricular hypodensities compatible with microvascular ischemic disease. Given extensive background parenchymal abnormalities, there is no CT evidence of acute large territory infarct. No intraparenchymal or extra-axial mass or hemorrhage. Unchanged size and configuration of the ventricles and basilar cisterns. No midline shift. Intracranial atherosclerosis. Limited visualization of the paranasal sinuses and mastoid air cells are normal. Regional soft tissues are normal. No displaced calvarial fracture. Dental wires are noted about the maxilla.  IMPRESSION: Advanced atrophy and microvascular ischemic disease without acute intracranial process.   Electronically Signed   By: Simonne ComeJohn  Watts M.D.   On: 10/27/2013 02:12     Assessment/Plan:  80y/o with history of DM and dementia admitted for new onset visual  hallucinations. Patient does appear to have a baseline dementia. He is currently afebrile with normal WBC and no signs of infection. Differential would include progression of his dementia vs metabolic encephalopathy vs occipital infarct vs seizure  1)MRI brain 2)EEG 3)B12, B1, TSH, ammonia 4)Correction of hyperglycemia 5)Neurology will follow   Elspeth Choeter Tylasia Fletchall, DO [facility] [phone]  If 7pm- 7am, please page neurology on call as listed in AMION. 10/27/2013, 6:40 AM

## 2013-10-27 NOTE — ED Notes (Signed)
Pt.'s son reported elevated blood sugar-403 at home this evening with visual hallucinations , respirations unlabored / alert but confused and disoriented.

## 2013-10-27 NOTE — ED Notes (Signed)
Report attempted 

## 2013-10-27 NOTE — Progress Notes (Signed)
Notified by RN for a change in mental status. Upon arrival patient resting comfortably. Mildly agitated and refusing to answer questions. Will check stat head CT but suspect likely sundowning.

## 2013-10-28 ENCOUNTER — Observation Stay (HOSPITAL_COMMUNITY): Payer: Medicare Other

## 2013-10-28 ENCOUNTER — Encounter (HOSPITAL_COMMUNITY): Payer: Self-pay | Admitting: Radiology

## 2013-10-28 DIAGNOSIS — E1165 Type 2 diabetes mellitus with hyperglycemia: Secondary | ICD-10-CM | POA: Diagnosis present

## 2013-10-28 DIAGNOSIS — N182 Chronic kidney disease, stage 2 (mild): Secondary | ICD-10-CM | POA: Diagnosis present

## 2013-10-28 DIAGNOSIS — Z880 Allergy status to penicillin: Secondary | ICD-10-CM | POA: Diagnosis not present

## 2013-10-28 DIAGNOSIS — F0390 Unspecified dementia without behavioral disturbance: Secondary | ICD-10-CM | POA: Diagnosis present

## 2013-10-28 DIAGNOSIS — I639 Cerebral infarction, unspecified: Secondary | ICD-10-CM | POA: Diagnosis present

## 2013-10-28 DIAGNOSIS — I4891 Unspecified atrial fibrillation: Secondary | ICD-10-CM | POA: Diagnosis present

## 2013-10-28 DIAGNOSIS — E039 Hypothyroidism, unspecified: Secondary | ICD-10-CM | POA: Diagnosis present

## 2013-10-28 DIAGNOSIS — I129 Hypertensive chronic kidney disease with stage 1 through stage 4 chronic kidney disease, or unspecified chronic kidney disease: Secondary | ICD-10-CM | POA: Diagnosis present

## 2013-10-28 DIAGNOSIS — D696 Thrombocytopenia, unspecified: Secondary | ICD-10-CM | POA: Diagnosis present

## 2013-10-28 DIAGNOSIS — I359 Nonrheumatic aortic valve disorder, unspecified: Secondary | ICD-10-CM

## 2013-10-28 DIAGNOSIS — Z23 Encounter for immunization: Secondary | ICD-10-CM | POA: Diagnosis not present

## 2013-10-28 DIAGNOSIS — R441 Visual hallucinations: Secondary | ICD-10-CM | POA: Diagnosis present

## 2013-10-28 DIAGNOSIS — Z7982 Long term (current) use of aspirin: Secondary | ICD-10-CM | POA: Diagnosis not present

## 2013-10-28 DIAGNOSIS — Z800 Family history of malignant neoplasm of digestive organs: Secondary | ICD-10-CM | POA: Diagnosis not present

## 2013-10-28 DIAGNOSIS — R739 Hyperglycemia, unspecified: Secondary | ICD-10-CM | POA: Diagnosis present

## 2013-10-28 DIAGNOSIS — E785 Hyperlipidemia, unspecified: Secondary | ICD-10-CM | POA: Diagnosis present

## 2013-10-28 LAB — LIPID PANEL
Cholesterol: 137 mg/dL (ref 0–200)
HDL: 37 mg/dL — ABNORMAL LOW (ref >39)
LDL Cholesterol: 85 mg/dL (ref 0–99)
Total CHOL/HDL Ratio: 3.7 RATIO
Triglycerides: 76 mg/dL (ref <150)
VLDL: 15 mg/dL (ref 0–40)

## 2013-10-28 LAB — HEMOGLOBIN A1C
Hgb A1c MFr Bld: 9.9 % — ABNORMAL HIGH (ref <5.7)
Mean Plasma Glucose: 237 mg/dL — ABNORMAL HIGH (ref <117)

## 2013-10-28 LAB — GLUCOSE, CAPILLARY
GLUCOSE-CAPILLARY: 190 mg/dL — AB (ref 70–99)
GLUCOSE-CAPILLARY: 111 mg/dL — AB (ref 70–99)
Glucose-Capillary: 254 mg/dL — ABNORMAL HIGH (ref 70–99)

## 2013-10-28 MED ORDER — QUETIAPINE FUMARATE 25 MG PO TABS
25.0000 mg | ORAL_TABLET | Freq: Every day | ORAL | Status: DC
  Administered 2013-10-28: 25 mg via ORAL
  Filled 2013-10-28 (×2): qty 1

## 2013-10-28 MED ORDER — IOHEXOL 350 MG/ML SOLN
50.0000 mL | Freq: Once | INTRAVENOUS | Status: AC | PRN
  Administered 2013-10-28: 50 mL via INTRAVENOUS

## 2013-10-28 MED ORDER — LORAZEPAM 2 MG/ML IJ SOLN
0.5000 mg | Freq: Once | INTRAMUSCULAR | Status: AC
  Administered 2013-10-28: 0.5 mg via INTRAVENOUS
  Filled 2013-10-28: qty 1

## 2013-10-28 NOTE — Progress Notes (Signed)
Occupational Therapy Evaluation Patient Details Name: Meribeth MattesRobert G Postlewaite MRN: [id number] DOB: [birth year] Today's Date: 10/28/2013    History of Present Illness Pt is a 80 y.o. male with history of DM and Afib with reports of continuing to have visual hallucinations per family reports. Patient's son states that patient started complaining of insects.  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     Clinical Impression   Pt is a very sweet 80 y.o. male admitted with a right parietal CVA. Pt currently with functional limitiations due to the deficits listed below (see OT problem list). Pt will benefit from skilled OT to increase independence and safety with adls and balance to allow discharge to SNF or home. Will need to discuss d/c plans with family to determine if they are able to provide appropriate level of care or if SNF placement is more appropriate.     Follow Up Recommendations  SNF;Supervision/Assistance - 24 hour    Equipment Recommendations  Other (comment) (Need to speak to family for more home information)    Recommendations for Other Services       Precautions / Restrictions Precautions Precautions: Fall Restrictions Weight Bearing Restrictions: No      Mobility Bed Mobility Overal bed mobility: Needs Assistance Bed Mobility: Supine to Sit;Sit to Supine     Supine to sit: Supervision Sit to supine: Supervision   General bed mobility comments: Pt required verbal cues for hand placement and tactile cues to lay down in bed  Transfers Overall transfer level: Needs assistance Equipment used: Rolling walker (2 wheeled) Transfers: Sit to/from Stand Sit to Stand: Min guard         General transfer comment: Pt pushes RW aside and walks without it when approaching target location (e.g. sink or bed). Min guard assist and verbal cues for hand placement and to stand up tall.    Balance Overall balance assessment: Needs  assistance Sitting-balance support: No upper extremity supported;Feet supported Sitting balance-Leahy Scale: Fair     Standing balance support: Bilateral upper extremity supported;During functional activity Standing balance-Leahy Scale: Poor                              ADL Overall ADL's : Needs assistance/impaired                         Toilet Transfer: Minimal assistance;BSC;RW Toilet Transfer Details (indicate cue type and reason): Required verbal cues for hand placement. Pt is impulsive and attempted to stand up without RW.  Toileting- Clothing Manipulation and Hygiene: Minimal assistance;Sit to/from stand;Cueing for safety Toileting - Clothing Manipulation Details (indicate cue type and reason): Pt able to move gown out of the way prior to sitting on BSC     Functional mobility during ADLs: Min guard;Cueing for safety;Cueing for sequencing;Rolling walker General ADL Comments: Pt is occasionally impulsive and leaves walker aside when ambulating to sink and bed and attempts to walk several steps without it. Pt demonstrated perseveration while washing/drying hands at sink.      Vision                     Perception     Praxis      Pertinent Vitals/Pain       Hand Dominance     Extremity/Trunk Assessment Upper Extremity Assessment Upper Extremity Assessment: Generalized weakness   Lower Extremity Assessment Lower Extremity Assessment:  Defer to PT evaluation   Cervical / Trunk Assessment Cervical / Trunk Assessment: Kyphotic (Posterior pelvic tilt )   Communication Communication Communication: HOH   Cognition Arousal/Alertness: Awake/alert Behavior During Therapy: WFL for tasks assessed/performed Overall Cognitive Status: Impaired/Different from baseline Area of Impairment: Orientation Orientation Level: Disoriented to;Place;Time;Situation Current Attention Level: Sustained Memory: Decreased short-term memory     Awareness:  Intellectual Problem Solving: Slow processing;Requires verbal cues General Comments: Pt hallucinating and reports seeing ants on floor and up walls several times during session.    General Comments       Exercises       Shoulder Instructions      Home Living                                   Additional Comments: Pt is a poor historian. Family unavailable during evaluation      Prior Functioning/Environment Level of Independence: Independent with assistive device(s)             OT Diagnosis: Generalized weakness;Cognitive deficits;Disturbance of vision;Altered mental status   OT Problem List: Decreased strength;Decreased activity tolerance;Impaired balance (sitting and/or standing);Impaired vision/perception;Decreased cognition;Decreased safety awareness;Decreased knowledge of use of DME or AE   OT Treatment/Interventions: Self-care/ADL training;DME and/or AE instruction;Therapeutic activities;Cognitive remediation/compensation;Visual/perceptual remediation/compensation;Patient/family education;Balance training    OT Goals(Current goals can be found in the care plan section) Acute Rehab OT Goals Patient Stated Goal: To go home OT Goal Formulation: With patient Time For Goal Achievement: 11/11/13 Potential to Achieve Goals: Fair ADL Goals Pt Will Perform Upper Body Bathing: with supervision;sitting Pt Will Perform Toileting - Clothing Manipulation and hygiene: with supervision;sit to/from stand  OT Frequency:     Barriers to D/C:            Co-evaluation              End of Session Equipment Utilized During Treatment: Gait belt;Rolling walker Nurse Communication: Mobility status;Precautions;Other (comment) (Bowel and bladder movement)  Activity Tolerance: Patient tolerated treatment well Patient left: in bed;with call bell/phone within reach;with bed alarm set;with nursing/sitter in room   Time: [id number] OT Time Calculation (min): 37  min Charges:  OT General Charges $OT Visit: 1 Procedure OT Evaluation $Initial OT Evaluation Tier I: 1 Procedure OT Treatments $Self Care/Home Management : 23-37 mins G-Codes:    Nils PyleBermel, Saddie Sandeen 10/28/2013, 4:38 PM

## 2013-10-28 NOTE — Progress Notes (Signed)
Echocardiogram 2D Echocardiogram has been performed.  Lawrence Coleman, Lawrence Coleman M 10/28/2013, 2:31 PM

## 2013-10-28 NOTE — Progress Notes (Signed)
Subjective: Had some agitation overnight.   Exam: Filed Vitals:   10/28/13 0652  BP: 112/74  Pulse: 79  Temp: 97.5 F (36.4 C)  Resp: 18   Gen: In bed, NAD MS: awake, alert, states "nursing home" for location, able to give correct year. Tells me that ants are crawling all over me.  WU:JWJXBCN:PERRL, EOMI Motor: MAEW Sensory:intact to LT   Impression: 80 yo M with delirium in the setting of dementia and hospitalization. His small infarct may have played some role in precipitating this, but I feel it is multifactorial at this point. I will start low dose seroquel at night to try to reduce sundowning.   Recommendations: 1) seroquel 25mg  around 6pm.  2) will continue to follow.   Ritta SlotMcNeill Shealynn Saulnier, MD [facility] [phone]  If 7pm- 7am, please page neurology on call as listed in AMION.

## 2013-10-28 NOTE — Progress Notes (Addendum)
Pt confused and combative. Np on call made aware new order received for IV ativan. Pt refusing to wear cardiac monitor.  Pt's granddaughter Florentina AddisonKatie at the bedside.  Will continue to monitor pt.

## 2013-10-28 NOTE — Progress Notes (Addendum)
On assessment Pt responsive to loud voice and pain. Pt is disoriented to place and situation. Pt refusing to follow commands at this time. Md on call made aware new order to a head ct.

## 2013-10-28 NOTE — Progress Notes (Signed)
SLP Cancellation Note  Patient Details Name: Lawrence MattesRobert G Coleman MRN: [id number] DOB: Apr 13, 1915   Cancelled treatment:       Reason Eval/Treat Not Completed: Patient at procedure or test/unavailable   ADAMS,PAT, M.S., CCC-SLP 10/28/2013, 4:20 PM

## 2013-10-28 NOTE — Progress Notes (Signed)
PROGRESS NOTE  Lawrence Coleman [id number] DOB: Apr 02, 1915 DOA: 10/27/2013 PCP: Janell QuietAMPBELL, PADONDA BOYD, NP  HPI/Recap of past 5224 hours: 80 year old white male with past medical history of diabetes and hypothyroidism admitted for visual hallucinations sudden onset and found to have acute CVA and sensory cortex. Workup in progress. Other etiologies ruled out.  Assessment/Plan: Principal Problem:   Acute CVA (cerebrovascular accident): Likely the underlying event which is triggered worsening delirium from underlying dementia. Workup in progress. On aspirin. Active Problems:   Hypothyroidism: TSH normal, continue Synthroid   Thrombocytopenia: Stable   Visual hallucination: Persisting, we'll try Seroquel tonight   Diabetes mellitus type 2, uncontrolled: Blood sugar stable, on sliding scale.   History of atrial fibrillation: Rate control   Code Status: Full code  Family Communication: Discussed with son  Disposition Plan: Home once solution issues are better controlled   Consultants:  Neurology  Procedures:  2-D echo: Preserved ejection fraction, no diastolic dysfunction  Antibiotics:  None   Objective: BP 111/57  Pulse 53  Temp(Src) 97.3 F (36.3 C) (Oral)  Resp 16  Ht 5\' 10"  (1.778 m)  Wt 67.7 kg (149 lb 4 oz)  BMI 21.42 kg/m2  SpO2 96%  Intake/Output Summary (Last 24 hours) at 10/28/13 1649 Last data filed at 10/28/13 1400  Gross per 24 hour  Intake      0 ml  Output    850 ml  Net   -850 ml   Filed Weights   10/27/13 0650 10/28/13 0652  Weight: 67.586 kg (149 lb) 67.7 kg (149 lb 4 oz)    Exam:   General:  Alert and oriented x2, no acute distress, still has visual hallucinations  Cardiovascular: Regular rhythm, occasional ectopic beat, 2/6 systolic ejection murmur  Respiratory: Clear to auscultation bilaterally  Abdomen: Soft, nontender, nondistended, positive bowel sounds  Musculoskeletal: No clubbing or cyanosis or edema   Data  Reviewed: Basic Metabolic Panel:  Recent Labs Lab 10/27/13 0030 10/27/13 0830  NA 139 139  K 4.3 4.2  CL 102 102  CO2 27 27  GLUCOSE 225* 164*  BUN 23 20  CREATININE 1.10 0.94  CALCIUM 9.1 9.1   Liver Function Tests:  Recent Labs Lab 10/27/13 0030 10/27/13 0830  AST 21 22  ALT 16 16  ALKPHOS 97 92  BILITOT 0.6 0.8  PROT 6.7 6.4  ALBUMIN 3.3* 3.3*   No results found for this basename: LIPASE, AMYLASE,  in the last 168 hours  Recent Labs Lab 10/27/13 0830  AMMONIA 11   CBC:  Recent Labs Lab 10/27/13 0030 10/27/13 0830  WBC 4.5 4.1  NEUTROABS 3.1 2.6  HGB 13.4 13.3  HCT 39.9 39.8  MCV 89.1 88.2  PLT 116* 100*   Cardiac Enzymes:   No results found for this basename: CKTOTAL, CKMB, CKMBINDEX, TROPONINI,  in the last 168 hours BNP (last 3 results) No results found for this basename: PROBNP,  in the last 8760 hours CBG:  Recent Labs Lab 10/27/13 1159 10/27/13 1659 10/27/13 2126 10/28/13 0744 10/28/13 1303  GLUCAP 142* 151* 148* 190* 111*    No results found for this or any previous visit (from the past 240 hour(s)).   Studies: Dg Chest 2 View  10/27/2013   CLINICAL DATA:  Hyperglycemia.  Hypertension.  Initial encounter.  EXAM: CHEST  2 VIEW  COMPARISON:  Of/ 26 as 2009  FINDINGS: Grossly unchanged enlarged cardiac silhouette and mediastinal contours with atherosclerotic plaque within a tortuous and at least ectatic thoracic  aorta. Improved aeration lungs. No focal airspace opacities. No pleural effusion or pneumothorax. No evidence of edema. Old fractures involving the right lateral ribs.  IMPRESSION: 1.  No acute cardiopulmonary disease. 2. Similar findings of cardiomegaly and atherosclerotic plaque within a tortuous and (at least) ectatic thoracic aorta.   Electronically Signed   By: Simonne ComeJohn  Watts M.D.   On: 10/27/2013 01:58   Ct Head Wo Contrast  10/27/2013   CLINICAL DATA:  80 year old male with altered mental status. Subsequent encounter.  EXAM:  CT HEAD WITHOUT CONTRAST  TECHNIQUE: Contiguous axial images were obtained from the base of the skull through the vertex without intravenous contrast.  COMPARISON:  10/27/2013 MR and CT.  FINDINGS: No intracranial hemorrhage.  CT detected punctate nonhemorrhagic right parietal lobe infarct not detected on the present plain film exam.  Prominent small vessel disease type changes.  No CT evidence of large acute infarct.  Global atrophy without hydrocephalus.  No intracranial mass lesion noted on this unenhanced exam.  Vascular calcifications.  IMPRESSION: No intracranial hemorrhage.  CT detected punctate nonhemorrhagic right parietal lobe infarct not detected on the present plain film exam.  Prominent small vessel disease type changes.  No CT evidence of large acute infarct.  Global atrophy without hydrocephalus.   Electronically Signed   By: Bridgett LarssonSteve  Olson M.D.   On: 10/27/2013 22:58   Ct Head Wo Contrast  10/27/2013   CLINICAL DATA:  Altered mental status. Elevated blood sugar. Visual hallucinations. Initial encounter.  EXAM: CT HEAD WITHOUT CONTRAST  TECHNIQUE: Contiguous axial images were obtained from the base of the skull through the vertex without intravenous contrast.  COMPARISON:  12/31/2010  FINDINGS: Re- demonstrated advanced atrophy with sulcal prominence and centralized volume loss with commensurate ex vacuo dilatation of the ventricular system, most conspicuously involving the occipital horn of the right lateral ventricle. Extensive periventricular hypodensities compatible with microvascular ischemic disease. Given extensive background parenchymal abnormalities, there is no CT evidence of acute large territory infarct. No intraparenchymal or extra-axial mass or hemorrhage. Unchanged size and configuration of the ventricles and basilar cisterns. No midline shift. Intracranial atherosclerosis. Limited visualization of the paranasal sinuses and mastoid air cells are normal. Regional soft tissues are  normal. No displaced calvarial fracture. Dental wires are noted about the maxilla.  IMPRESSION: Advanced atrophy and microvascular ischemic disease without acute intracranial process.   Electronically Signed   By: Simonne ComeJohn  Watts M.D.   On: 10/27/2013 02:12   Mr Brain Wo Contrast  10/27/2013   CLINICAL DATA:  Hallucinations being and 9 o'clock p.m. last night.  EXAM: MRI HEAD WITHOUT CONTRAST  TECHNIQUE: Multiplanar, multiecho pulse sequences of the brain and surrounding structures were obtained without intravenous contrast.  COMPARISON:  CT head without contrast from the same day.  FINDINGS: A punctate area of focal restricted diffusion is present in the primary sensory cortex near the vertex on the right. There is no associated hemorrhage or mass lesion.  Advanced generalized atrophy and white matter disease is present bilaterally. No hemorrhage or mass lesion is present. The ventricles are proportionate to the degree of atrophy.  Dilated perivascular spaces are present in the basal ganglia bilaterally. Fluid is present in the major intracranial arteries. The globes and orbits are intact. The paranasal sinuses and the mastoid air cells are clear.  IMPRESSION: 1. Punctate nonhemorrhagic infarct in the right parietal post central gyrus, likely involving the primary sensory cortex. 2. Advanced generalized atrophy and bilateral diffuse white matter disease. These results will be called  to the ordering clinician or representative by the Radiologist Assistant, and communication documented in the PACS or zVision Dashboard.   Electronically Signed   By: Gennette Pac M.D.   On: 10/27/2013 12:21    Scheduled Meds: .  stroke: mapping our early stages of recovery book   Does not apply Once  . aspirin  300 mg Rectal Daily   Or  . aspirin  325 mg Oral Daily  . Influenza vac split quadrivalent PF  0.5 mL Intramuscular Tomorrow-1000  . insulin aspart  0-9 Units Subcutaneous TID WC  . QUEtiapine  25 mg Oral QPC supper     Continuous Infusions:    Time spent: 15 minutes  Hollice Espy  [facility] Pager [phone]. If 7PM-7AM, please contact night-coverage at [email or web address], password [facility] 10/28/2013, 4:49 PM  LOS: 1 day

## 2013-10-28 NOTE — Progress Notes (Signed)
I agree with the following treatment note after reviewing documentation.   Kwadwo Taras, Brynn OTR/L Pager: [phone] Office: [phone] .   

## 2013-10-28 NOTE — Evaluation (Signed)
Physical Therapy Evaluation Patient Details Name: Lawrence MattesRobert G Coleman MRN: [id number] DOB: [birth year] Today's Date: 10/28/2013   History of Present Illness  Pt is a 80 y.o. male with history of DM and Afib with reports of continuing to have visual hallucinations per family reports. Patient's son states that patient started complaining of insects.  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    Clinical Impression  Pt admitted with the above. Pt currently with functional limitations due to the deficits listed below (see PT Problem List).Pt will benefit from skilled PT to increase their independence and safety with mobility to allow discharge to the venue listed below.      Follow Up Recommendations Home health PT;Supervision/Assistance - 24 hour (For home safety evaluation. )    Equipment Recommendations  Other (comment) (Pt's family requesting shower chair)    Recommendations for Other Services       Precautions / Restrictions Precautions Precautions: Fall      Mobility  Bed Mobility Overal bed mobility: Needs Assistance Bed Mobility: Supine to Sit;Sit to Supine     Supine to sit: Supervision Sit to supine: Supervision   General bed mobility comments: supevision for safety due to occasional impulsiveness  Transfers Overall transfer level: Needs assistance Equipment used: Rolling walker (2 wheeled) Transfers: Sit to/from Stand Sit to Stand: Min guard         General transfer comment: MInguard for safety with cues for hand placement and proper body position prior to sitting.   Ambulation/Gait Ambulation/Gait assistance: Min assist Ambulation Distance (Feet): 100 Feet Assistive device: Rolling walker (2 wheeled) Gait Pattern/deviations: Step-through pattern;Decreased stride length;Shuffle;Trunk flexed     General Gait Details: Pt needs assistance to manage RW at times for overall safety.   Stairs            Wheelchair Mobility     Modified Rankin (Stroke Patients Only) Modified Rankin (Stroke Patients Only) Pre-Morbid Rankin Score: Moderate disability Modified Rankin: Moderately severe disability     Balance Overall balance assessment: Needs assistance         Standing balance support: Bilateral upper extremity supported Standing balance-Leahy Scale: Fair                               Pertinent Vitals/Pain      Home Living Family/patient expects to be discharged to:: Private residence Living Arrangements: Children;Spouse/significant other Available Help at Discharge: Family Type of Home: House Home Access: Stairs to enter Entrance Stairs-Rails: Doctor, general practiceight;Left Entrance Stairs-Number of Steps: 3 Home Layout: One level Home Equipment: Walker - 2 wheels      Prior Function Level of Independence: Independent with assistive device(s)         Comments: Family provides 24 hour assistance for pt and pt's wife.  Family does all cooking and cleaning.  Pt ambulates with RW with supervision and completes toilet hygiene independently using urnial at bedside for night time.  Pt does not like to use shower and family has difficulty encouraging pt to clean up.      Hand Dominance        Extremity/Trunk Assessment   Upper Extremity Assessment: Defer to OT evaluation           Lower Extremity Assessment: Overall WFL for tasks assessed      Cervical / Trunk Assessment: Kyphotic  Communication   Communication: No difficulties  Cognition Arousal/Alertness: Awake/alert Behavior During Therapy: Restless Overall Cognitive  Status: Impaired/Different from baseline Area of Impairment: Orientation;Attention;Awareness;Problem solving Orientation Level: Disoriented to;Place;Time;Situation Current Attention Level: Sustained       Awareness: Emergent Problem Solving: Slow processing;Difficulty sequencing General Comments: Pt unable to recall family names in room which is not normal for pt.   Pt also hallucinating seeing ants in room.   Pt was given ativan last night ~ 1 am to help pt fall asleep and also due to beening combative.     General Comments      Exercises        Assessment/Plan    PT Assessment Patient needs continued PT services  PT Diagnosis Difficulty walking   PT Problem List Decreased activity tolerance;Decreased balance;Decreased knowledge of use of DME;Decreased safety awareness  PT Treatment Interventions DME instruction;Gait training;Stair training;Functional mobility training;Therapeutic activities;Therapeutic exercise;Balance training;Neuromuscular re-education;Patient/family education   PT Goals (Current goals can be found in the Care Plan section) Acute Rehab PT Goals Patient Stated Goal: To go home PT Goal Formulation: With patient/family Time For Goal Achievement: 11/04/13 Potential to Achieve Goals: Good    Frequency Min 4X/week   Barriers to discharge        Co-evaluation               End of Session Equipment Utilized During Treatment: Gait belt Activity Tolerance: Patient tolerated treatment well Patient left: in bed;with call bell/phone within reach;with bed alarm set Nurse Communication: Mobility status         Time: 0940-1020 PT Time Calculation (min): 40 min   Charges:   PT Evaluation $Initial PT Evaluation Tier I: 1 Procedure PT Treatments $Gait Training: 8-22 mins $Therapeutic Activity: 23-37 mins   PT G Codes:          Estephani Popper 10/28/2013, 10:24 AM  Jake SharkWendy Rontrell Moquin, PT DPT [phone]

## 2013-10-28 NOTE — Progress Notes (Signed)
UR completed 

## 2013-10-29 DIAGNOSIS — E785 Hyperlipidemia, unspecified: Secondary | ICD-10-CM

## 2013-10-29 DIAGNOSIS — N182 Chronic kidney disease, stage 2 (mild): Secondary | ICD-10-CM

## 2013-10-29 LAB — GLUCOSE, CAPILLARY
Glucose-Capillary: 160 mg/dL — ABNORMAL HIGH (ref 70–99)
Glucose-Capillary: 203 mg/dL — ABNORMAL HIGH (ref 70–99)

## 2013-10-29 MED ORDER — QUETIAPINE FUMARATE 25 MG PO TABS: 25.0000 mg | ORAL_TABLET | Freq: Every evening | ORAL | Status: DC | PRN

## 2013-10-29 MED ORDER — QUETIAPINE FUMARATE 25 MG PO TABS: 25.0000 mg | ORAL_TABLET | Freq: Every day | ORAL | Status: DC

## 2013-10-29 MED ORDER — METFORMIN HCL 500 MG PO TABS: 500.0000 mg | ORAL_TABLET | Freq: Two times a day (BID) | ORAL | Status: DC

## 2013-10-29 MED ORDER — ATORVASTATIN CALCIUM 10 MG PO TABS
10.0000 mg | ORAL_TABLET | Freq: Every day | ORAL | Status: DC
  Administered 2013-10-29: 10 mg via ORAL
  Filled 2013-10-29: qty 1

## 2013-10-29 MED ORDER — ATORVASTATIN CALCIUM 10 MG PO TABS: 10.0000 mg | ORAL_TABLET | Freq: Every day | ORAL | Status: DC

## 2013-10-29 NOTE — Care Management Note (Signed)
    Page 1 of 2   10/29/2013     2:47:03 PM CARE MANAGEMENT NOTE 10/29/2013  Patient:  DEMARI, GALES   Account Number:  [id number]  Date Initiated:  10/29/2013  Documentation initiated by:  [facility]  Subjective/Objective Assessment:   adm:  Acute CVA (cerebrovascular accident):     Action/Plan:   discharge planning   Anticipated DC Date:  10/29/2013   Anticipated DC Plan:  [place]  CM consult      [facility] Choice  HOME HEALTH   Choice offered to / List presented to:  C-4 Adult Children   DME arranged  SHOWER STOOL      DME agency  [place] arranged  [place].   Status of service:   Medicare Important Message given?   (If response is "NO", the following Medicare IM given date fields will be blank) Date Medicare IM given:   Medicare IM given by:   Date Additional Medicare IM given:   Additional Medicare IM given by:    Discharge Disposition:  [place]  Per UR Regulation:    If discussed at Long Length of Stay Meetings, dates discussed:    Comments:  10/29/13 12:11 Cm met with pt and son, Fritz Pickerel [phone] in room to offer choice of home health agency.  Fritz Pickerel [facility] to render HHPT/OT.  Fritz Pickerel would like DME to deliver stool to room prior to discharge and so Fritz Pickerel can measure it to make sure it's small enough to fit in tub. CM called DME rep, Jeneen Rinks to explain request.  Address and contact, Fritz Pickerel verified by Fritz Pickerel.  Referral called to [facility] rep, Miranda with Larry's contact number.  No other CM needs ere communicated.  Opal Sidles, BSN, [place].

## 2013-10-29 NOTE — Discharge Instructions (Signed)
Ischemic Stroke Blood carries oxygen to all areas of your body. A stroke happens when your blood does not flow to your brain like normal. If this happens, your brain will not get the oxygen it needs and brain tissue will die. This is an emergency. Problems (symptoms) of a stroke usually happen suddenly. You may notice them when you wake up. They can include:  Loss of feeling or weakness on one side of the body (face, arm, leg).  Feeling confused.  Trouble talking or understanding.  Trouble seeing.  Trouble walking.  Feeling dizzy.  Loss of balance or coordination.  Severe headache without a cause.  Trouble reading or writing. Get help as soon as any of these problems first start. This is important.  RISK FACTORS  Risk factors are things that make it more likely for you to have a stroke. These things include:  High blood pressure (hypertension).  High cholesterol.  Diabetes.  Heart disease.  Having a buildup of fatty deposits in the blood vessels.  Having an abnormal heart rhythm (atrial fibrillation).  Being very overweight (obese).  Smoking.  Taking birth control pills, especially if you smoke.  **Not being active.  **Having a diet high in fats, salt, and calories.  Drinking too much alcohol.  Using illegal drugs.  Being African American.  Being over the age of 55.  Having a family history of stroke.  Having a history of blood clots, stroke, warning stroke (transient ischemic attack, TIA), or heart attack.  Sickle cell disease. HOME CARE  Take all medicines exactly as told by your doctor. Understand all your medicine instructions.  You may need to take a medicine to thin your blood, like aspirin or warfarin. Take warfarin exactly as told.  Taking too much or too little warfarin is dangerous. Get regular blood tests as told, including the PT and INR tests. The test results help your doctor adjust your dose of warfarin. Your PT and INR levels must be  done as often as told by your doctor.  Food can cause problems with warfarin and affect the results of your blood tests. This is true for foods high in vitamin K, such as spinach, kale, broccoli, cabbage, collard and turnip greens, Brussels sprouts, peas, cauliflower, seaweed, and parsley, as well as beef and pork liver, green tea, and soybean oil. Eat the same amount of food high in vitamin K. Avoid major changes in your diet. Tell your doctor before changing your diet. Talk to a food specialist (dietitian) if you have questions.  Many medicines can cause problems with warfarin and affect your PT and INR test results. Tell your doctor about all medicines you take. This includes vitamins and dietary pills (supplements). Be careful with aspirin and medicines that relieve redness, soreness, and puffiness (inflammation). Do not take or stop medicines unless your doctor tells you to.  Warfarin can cause a lot of bruising or bleeding. Hold pressure over cuts for longer than normal. Talk to your doctor about other side effects of warfarin.  Avoid sports or activities that may cause injury or bleeding.  Be careful when you shave, floss your teeth, or use sharp objects.  Avoid alcoholic drinks or drink very little alcohol while taking warfarin. Tell your doctor if you change how much alcohol you drink.  Tell your dentist and other doctors that you take warfarin before procedures.  If you are able to swallow, eat healthy foods. Eat 5 or more servings of fruits and vegetables a day. Eat soft   foods, pureed foods, or eat small bites of food so you do not choke.  Follow your diet program as told, if you are given one.  Keep a healthy weight.  Stay active. Try to get at least 30 minutes of activity on most or all days.  Do not smoke.  Limit how much alcohol you drink even if you are not taking warfarin. Moderate alcohol use is:  No more than 2 drinks each day for men.  No more than 1 drink each day  for women who are not pregnant.  Keep your home safe so you do not fall. Try:  Putting grab bars in the bedroom and bathroom.  Raising toilet seats.  Putting a seat in the shower.  Go to therapy sessions (physical, occupational, and speech) as told by your doctor.  Use a walker or cane at all times if told to do so.  Keep all doctor visits as told. GET HELP IF:  Your personality changes.  You have trouble swallowing.  You are seeing two of everything.  You are dizzy.  You have a fever.  Your skin starts to break down. GET HELP RIGHT AWAY IF:  The symptoms below may be a sign of an emergency. Do not wait to see if the symptoms go away. Call for help (911 in [place] Do not drive yourself to the hospital.  You have sudden weakness or numbness on the face, arm, or leg (especially on one side of the body).  You have sudden trouble walking or moving your arms or legs.  You have sudden confusion.  You have trouble talking or understanding.  You have sudden trouble seeing in one or both eyes.  You lose your balance or your movements are not smooth.  You have a sudden, severe headache with no known cause.  You have new chest pain or you feel your heart beating in an unsteady way.  You are partly or totally unaware of what is going on around you. Document Released: 12/18/2010 Document Revised: 05/15/2013 Document Reviewed: 08/09/2011 [employer] Patient Information 2015 [employer]. This information is not intended to replace advice given to you by your health care provider. Make sure you discuss any questions you have with your health care provider.  

## 2013-10-29 NOTE — Discharge Summary (Signed)
Discharge Summary  Lawrence Coleman ZOX:[id number] DOB: 1915-08-14  PCP: Janell Quiet, NP  Admit date: 10/27/2013 Discharge date: 10/29/2013  Time spent: 25 minutes  Recommendations for Outpatient Follow-up:  1. New medication: Lipitor 10 mg by mouth each bedtime 2. New medications: Seroquel 25 mg by mouth each bedtime when necessary for hallucinations and agitation 3. New medication: Metformin 500 mg by mouth twice a day   Discharge Diagnoses:  Active Hospital Problems   Diagnosis Date Noted  . Acute CVA (cerebrovascular accident) 10/27/2013  . CKD (chronic kidney disease) stage 2, GFR 60-89 ml/min 10/29/2013  . Visual hallucination 10/27/2013  . Diabetes mellitus type 2, uncontrolled 10/27/2013  . History of atrial fibrillation 10/27/2013  . Thrombocytopenia 08/28/2009  . Hyperlipidemia 01/25/2007    Resolved Hospital Problems   Diagnosis Date Noted Date Resolved  . Hypothyroidism 01/25/2007 10/29/2013    Discharge Condition: Improved, being discharged home under the care of his son  Diet recommendation: Heart healthy  Filed Weights   10/27/13 0650 10/28/13 0652  Weight: 67.586 kg (149 lb) 67.7 kg (149 lb 4 oz)    History of present illness:  Patient is a 80 year old white male with past medical history of reported hypothyroidism who was admitted for visual hallucinations, specifically seeing insects crawling around on the wall and on people, but otherwise intact sensorium-sudden onset  Hospital Course:  Principal Problem:   Acute CVA (cerebrovascular accident): An MRI was done which confirmed an acute CVA in the right parietal sensory cortex. Seen by neurology. They felt that this alone did not necessarily fully explain the patient's sole symptoms. It was felt likely he may some very mild dementia and was having some acute delirium and agitation from the event of a CVA. He was tried on a course of Seroquel which she actually responded to well and by 10/18  was felt to be back at his baseline. With stroke workup, he was found to have an LDL of 85 and an A1c of 9.9. Interestingly, he is reported history of hypothyroidism although TSH is normal patient is not on Synthroid. Patient will continue on his daily aspirin and as per recommended by neurology low-dose Lipitor and treating his diabetes would be optimal to prevent further stroke, even at his advanced age. Seen by PT and OT recommended home health. Stroke is felt to be secondary to small vessel disease Active Problems:   Hyperlipidemia: As above, start on Lipitor low dose    Thrombocytopenia: Stable, no evidence of bleeding   Visual hallucination: With Seroquel, is much improved   Diabetes mellitus type 2, uncontrolled: Patient previously reportedly had a history of diabetes mellitus which was controlled and he is not on any medication. Noted be hyperglycemic here and A1c was 9.9 so started on metformin   History of atrial fibrillation: Remained in normal sinus rhythm during this hospitalization. Embolic CVA from atrial fibrillation not felt to be cause    CKD (chronic kidney disease) stage 2, GFR 60-89 ml/min: At baseline   Procedures:  Echocardiogram done 10/17: Stable, preserved ejection fraction, no evidence of diastolic dysfunction  Consultations:  Neurology  Discharge Exam: BP 101/69  Pulse 84  Temp(Src) 97.7 F (36.5 C) (Axillary)  Resp 18  Ht 5\' 10"  (1.778 m)  Wt 67.7 kg (149 lb 4 oz)  BMI 21.42 kg/m2  SpO2 98%  General: Patient is alert and oriented x2, no acute distress Cardiovascular: Regular rate and rhythm, S1-S2 Respiratory: Clear to auscultation bilaterally  Discharge Instructions You were  cared for by a hospitalist during your hospital stay. If you have any questions about your discharge medications or the care you received while you were in the hospital after you are discharged, you can call the unit and asked to speak with the hospitalist on call if the  hospitalist that took care of you is not available. Once you are discharged, your primary care physician will handle any further medical issues. Please note that NO REFILLS for any discharge medications will be authorized once you are discharged, as it is imperative that you return to your primary care physician (or establish a relationship with a primary care physician if you do not have one) for your aftercare needs so that they can reassess your need for medications and monitor your lab values.  Discharge Instructions   Diet - low sodium heart healthy    Complete by:  As directed      Increase activity slowly    Complete by:  As directed             Medication List         atorvastatin 10 MG tablet  Commonly known as:  LIPITOR  Take 1 tablet (10 mg total) by mouth daily at 6 PM.     BAYER LOW STRENGTH 81 MG EC tablet  Generic drug:  aspirin  Take 162 mg by mouth daily.     metFORMIN 500 MG tablet  Commonly known as:  GLUCOPHAGE  Take 1 tablet (500 mg total) by mouth 2 (two) times daily with a meal.     QUEtiapine 25 MG tablet  Commonly known as:  SEROQUEL  Take 1 tablet (25 mg total) by mouth at bedtime as needed (Agitation, hallucinations).       Allergies  Allergen Reactions  . Penicillins Other (See Comments)    unknown       Follow-up Information   Follow up with [facility]. (home health physical and occupational therapy)    Contact information:   [address] [place] [address] [phone]       Follow up with CAMPBELL, PADONDA BOYD, NP In 1 month.   Specialty:  Family Medicine   Contact information:   [address] [place] [address] [phone]        The results of significant diagnostics from this hospitalization (including imaging, microbiology, ancillary and laboratory) are listed below for reference.    Significant Diagnostic Studies: Ct Angio Head W/cm &/or Wo Cm  10/28/2013   CLINICAL DATA:   Hallucinations. Punctate right parietal infarct on MRI.  EXAM: CT ANGIOGRAPHY HEAD AND NECK  TECHNIQUE: Multidetector CT imaging of the head and neck was performed using the standard protocol during bolus administration of intravenous contrast. Multiplanar CT image reconstructions and MIPs were obtained to evaluate the vascular anatomy. Carotid stenosis measurements (when applicable) are obtained utilizing NASCET criteria, using the distal internal carotid diameter as the denominator.  CONTRAST:  100mL OMNIPAQUE IOHEXOL 350 MG/ML SOLN  COMPARISON:  Head MRI 10/27/2013 and CT 10/27/2013  FINDINGS: CTA HEAD FINDINGS  The patient was confused and sat up during the middle of the first CTA acquisition. An additional 50 mL contrast was administered and a second CT acquisition obtained (total volume 100 mL IV contrast administered).  Advanced cerebral atrophy is unchanged. Periventricular white matter hypodensities do not appear significantly changed and are nonspecific but compatible with moderate chronic small vessel ischemic disease. Punctate acute right parietal infarct described on recent MRI  is not demonstrated on this CT. No acute large territory infarct is identified. There is no evidence of intracranial hemorrhage, mass, midline shift, or extra-axial fluid collection. No abnormal enhancement is identified. Orbits are unremarkable. Paranasal sinuses and mastoid air cells are clear.  Intracranial vertebral arteries are tortuous and patent to the basilar bilaterally. PICA, AICA, and SCA origins are patent. Basilar artery is patent without stenosis. There are patent bilateral posterior communicating arteries, left larger than right. The left P1 segment is hypoplastic.  Internal carotid arteries are patent from skullbase to carotid termini. There is mild carotid siphon calcification without stenosis. Small right posterior communicating artery origin infundibulum is noted. ACAs and MCAs are unremarkable. No intracranial  aneurysm is identified.  Review of the MIP images confirms the above findings.  CTA NECK FINDINGS  3 vessel aortic arch. There is advanced atherosclerosis of the aortic arch with heavy calcified plaque as well as irregular noncalcified plaque resulting in luminal irregularity. Arch vessel origins are widely patent. Brachiocephalic and subclavian arteries are patent without stenosis. Common carotid arteries are patent without stenosis. There is mild atherosclerotic calcification involving the left greater the right carotid bifurcations. Internal carotid arteries are patent without stenosis. Vertebral arteries are patent without stenosis. Right vertebral artery is mildly dominant.  There is moderate multilevel cervical disc degeneration, greatest at C5-6. Advanced multilevel cervical facet arthrosis is noted.  Review of the MIP images confirms the above findings.  IMPRESSION: 1. No major intracranial arterial occlusion. Mild, nonstenotic carotid siphon calcification. 2. Mild calcification at the carotid bulbs without cervical carotid stenosis. No vertebral artery stenosis. 3. Moderate chronic small vessel ischemic disease and cerebral atrophy.   Electronically Signed   By: Sebastian Ache   On: 10/28/2013 12:31   Dg Chest 2 View  10/27/2013   CLINICAL DATA:  Hyperglycemia.  Hypertension.  Initial encounter.  EXAM: CHEST  2 VIEW  COMPARISON:  Of/ 26 as 2009  FINDINGS: Grossly unchanged enlarged cardiac silhouette and mediastinal contours with atherosclerotic plaque within a tortuous and at least ectatic thoracic aorta. Improved aeration lungs. No focal airspace opacities. No pleural effusion or pneumothorax. No evidence of edema. Old fractures involving the right lateral ribs.  IMPRESSION: 1.  No acute cardiopulmonary disease. 2. Similar findings of cardiomegaly and atherosclerotic plaque within a tortuous and (at least) ectatic thoracic aorta.   Electronically Signed   By: Simonne Come M.D.   On: 10/27/2013 01:58    Ct Head Wo Contrast  10/27/2013   CLINICAL DATA:  80 year old male with altered mental status. Subsequent encounter.  EXAM: CT HEAD WITHOUT CONTRAST  TECHNIQUE: Contiguous axial images were obtained from the base of the skull through the vertex without intravenous contrast.  COMPARISON:  10/27/2013 MR and CT.  FINDINGS: No intracranial hemorrhage.  CT detected punctate nonhemorrhagic right parietal lobe infarct not detected on the present plain film exam.  Prominent small vessel disease type changes.  No CT evidence of large acute infarct.  Global atrophy without hydrocephalus.  No intracranial mass lesion noted on this unenhanced exam.  Vascular calcifications.  IMPRESSION: No intracranial hemorrhage.  CT detected punctate nonhemorrhagic right parietal lobe infarct not detected on the present plain film exam.  Prominent small vessel disease type changes.  No CT evidence of large acute infarct.  Global atrophy without hydrocephalus.   Electronically Signed   By: Bridgett Larsson M.D.   On: 10/27/2013 22:58   Ct Head Wo Contrast  10/27/2013   CLINICAL DATA:  Altered mental status.  Elevated blood sugar. Visual hallucinations. Initial encounter.  EXAM: CT HEAD WITHOUT CONTRAST  TECHNIQUE: Contiguous axial images were obtained from the base of the skull through the vertex without intravenous contrast.  COMPARISON:  12/31/2010  FINDINGS: Re- demonstrated advanced atrophy with sulcal prominence and centralized volume loss with commensurate ex vacuo dilatation of the ventricular system, most conspicuously involving the occipital horn of the right lateral ventricle. Extensive periventricular hypodensities compatible with microvascular ischemic disease. Given extensive background parenchymal abnormalities, there is no CT evidence of acute large territory infarct. No intraparenchymal or extra-axial mass or hemorrhage. Unchanged size and configuration of the ventricles and basilar cisterns. No midline shift. Intracranial  atherosclerosis. Limited visualization of the paranasal sinuses and mastoid air cells are normal. Regional soft tissues are normal. No displaced calvarial fracture. Dental wires are noted about the maxilla.  IMPRESSION: Advanced atrophy and microvascular ischemic disease without acute intracranial process.   Electronically Signed   By: Simonne ComeJohn  Watts M.D.   On: 10/27/2013 02:12   Ct Angio Neck W/cm &/or Wo/cm  10/28/2013   CLINICAL DATA:  Hallucinations. Punctate right parietal infarct on MRI.  EXAM: CT ANGIOGRAPHY HEAD AND NECK  TECHNIQUE: Multidetector CT imaging of the head and neck was performed using the standard protocol during bolus administration of intravenous contrast. Multiplanar CT image reconstructions and MIPs were obtained to evaluate the vascular anatomy. Carotid stenosis measurements (when applicable) are obtained utilizing NASCET criteria, using the distal internal carotid diameter as the denominator.  CONTRAST:  100mL OMNIPAQUE IOHEXOL 350 MG/ML SOLN  COMPARISON:  Head MRI 10/27/2013 and CT 10/27/2013  FINDINGS: CTA HEAD FINDINGS  The patient was confused and sat up during the middle of the first CTA acquisition. An additional 50 mL contrast was administered and a second CT acquisition obtained (total volume 100 mL IV contrast administered).  Advanced cerebral atrophy is unchanged. Periventricular white matter hypodensities do not appear significantly changed and are nonspecific but compatible with moderate chronic small vessel ischemic disease. Punctate acute right parietal infarct described on recent MRI is not demonstrated on this CT. No acute large territory infarct is identified. There is no evidence of intracranial hemorrhage, mass, midline shift, or extra-axial fluid collection. No abnormal enhancement is identified. Orbits are unremarkable. Paranasal sinuses and mastoid air cells are clear.  Intracranial vertebral arteries are tortuous and patent to the basilar bilaterally. PICA, AICA,  and SCA origins are patent. Basilar artery is patent without stenosis. There are patent bilateral posterior communicating arteries, left larger than right. The left P1 segment is hypoplastic.  Internal carotid arteries are patent from skullbase to carotid termini. There is mild carotid siphon calcification without stenosis. Small right posterior communicating artery origin infundibulum is noted. ACAs and MCAs are unremarkable. No intracranial aneurysm is identified.  Review of the MIP images confirms the above findings.  CTA NECK FINDINGS  3 vessel aortic arch. There is advanced atherosclerosis of the aortic arch with heavy calcified plaque as well as irregular noncalcified plaque resulting in luminal irregularity. Arch vessel origins are widely patent. Brachiocephalic and subclavian arteries are patent without stenosis. Common carotid arteries are patent without stenosis. There is mild atherosclerotic calcification involving the left greater the right carotid bifurcations. Internal carotid arteries are patent without stenosis. Vertebral arteries are patent without stenosis. Right vertebral artery is mildly dominant.  There is moderate multilevel cervical disc degeneration, greatest at C5-6. Advanced multilevel cervical facet arthrosis is noted.  Review of the MIP images confirms the above findings.  IMPRESSION: 1. No major  intracranial arterial occlusion. Mild, nonstenotic carotid siphon calcification. 2. Mild calcification at the carotid bulbs without cervical carotid stenosis. No vertebral artery stenosis. 3. Moderate chronic small vessel ischemic disease and cerebral atrophy.   Electronically Signed   By: Sebastian Ache   On: 10/28/2013 12:31   Mr Brain Wo Contrast  10/27/2013   CLINICAL DATA:  Hallucinations being and 9 o'clock p.m. last night.  EXAM: MRI HEAD WITHOUT CONTRAST  TECHNIQUE: Multiplanar, multiecho pulse sequences of the brain and surrounding structures were obtained without intravenous contrast.   COMPARISON:  CT head without contrast from the same day.  FINDINGS: A punctate area of focal restricted diffusion is present in the primary sensory cortex near the vertex on the right. There is no associated hemorrhage or mass lesion.  Advanced generalized atrophy and white matter disease is present bilaterally. No hemorrhage or mass lesion is present. The ventricles are proportionate to the degree of atrophy.  Dilated perivascular spaces are present in the basal ganglia bilaterally. Fluid is present in the major intracranial arteries. The globes and orbits are intact. The paranasal sinuses and the mastoid air cells are clear.  IMPRESSION: 1. Punctate nonhemorrhagic infarct in the right parietal post central gyrus, likely involving the primary sensory cortex. 2. Advanced generalized atrophy and bilateral diffuse white matter disease. These results will be called to the ordering clinician or representative by the Radiologist Assistant, and communication documented in the PACS or zVision Dashboard.   Electronically Signed   By: Gennette Pac M.D.   On: 10/27/2013 12:21    Microbiology: No results found for this or any previous visit (from the past 240 hour(s)).   Labs: Basic Metabolic Panel:  Recent Labs Lab 10/27/13 0030 10/27/13 0830  NA 139 139  K 4.3 4.2  CL 102 102  CO2 27 27  GLUCOSE 225* 164*  BUN 23 20  CREATININE 1.10 0.94  CALCIUM 9.1 9.1   Liver Function Tests:  Recent Labs Lab 10/27/13 0030 10/27/13 0830  AST 21 22  ALT 16 16  ALKPHOS 97 92  BILITOT 0.6 0.8  PROT 6.7 6.4  ALBUMIN 3.3* 3.3*   No results found for this basename: LIPASE, AMYLASE,  in the last 168 hours  Recent Labs Lab 10/27/13 0830  AMMONIA 11   CBC:  Recent Labs Lab 10/27/13 0030 10/27/13 0830  WBC 4.5 4.1  NEUTROABS 3.1 2.6  HGB 13.4 13.3  HCT 39.9 39.8  MCV 89.1 88.2  PLT 116* 100*   Cardiac Enzymes: No results found for this basename: CKTOTAL, CKMB, CKMBINDEX, TROPONINI,  in the  last 168 hours BNP: BNP (last 3 results) No results found for this basename: PROBNP,  in the last 8760 hours CBG:  Recent Labs Lab 10/28/13 0744 10/28/13 1303 10/28/13 1655 10/29/13 0738 10/29/13 1151  GLUCAP 190* 111* 254* 160* 203*       Signed:  Kasumi Ditullio K  [facility] 10/29/2013, 4:22 PM

## 2013-10-31 ENCOUNTER — Telehealth: Payer: Self-pay | Admitting: Family

## 2013-10-31 NOTE — Telephone Encounter (Signed)
Lawrence Coleman for [facility] is calling to let md know pt is receiving home health services. Linford ArnoldJerilynn is requesting skill nursing and home health aid to help with bathing and dressing. Please call with verbal order

## 2013-10-31 NOTE — Telephone Encounter (Signed)
Needs help with diabetes management and home health aid. Verbal order given and Jerilynn states Peyton NajjarLarry, pt's son, should be calling today to schedule an appointment

## 2013-11-02 ENCOUNTER — Telehealth: Payer: Self-pay | Admitting: Family

## 2013-11-02 NOTE — Telephone Encounter (Signed)
Beth w/ [facility] called to fu on statement of medical necessity for pt. sent  In yesterday, advised beth pt not seen since 12/14 and pt has appt 10/30. Waynetta SandyBeth will resubmit  after that.

## 2013-11-02 NOTE — Telephone Encounter (Signed)
Thank you :)

## 2013-11-03 ENCOUNTER — Telehealth: Payer: Self-pay | Admitting: Family

## 2013-11-03 NOTE — Telephone Encounter (Signed)
Lawrence Coleman, w/ [facility] would like to know if you want to set up nursing to monitor pt?  He is doing pretty well, but she can come out for a while if you want.

## 2013-11-03 NOTE — Telephone Encounter (Signed)
Please advise 

## 2013-11-06 NOTE — Telephone Encounter (Signed)
Left message for danyelle to call back

## 2013-11-06 NOTE — Telephone Encounter (Signed)
Yes, lets do that for a while.

## 2013-11-07 NOTE — Telephone Encounter (Signed)
Lawrence Coleman will order visits for pt to be seen by home healthcare

## 2013-11-10 ENCOUNTER — Ambulatory Visit (INDEPENDENT_AMBULATORY_CARE_PROVIDER_SITE_OTHER): Payer: Medicare Other | Admitting: Family

## 2013-11-10 ENCOUNTER — Encounter: Payer: Self-pay | Admitting: Family

## 2013-11-10 VITALS — BP 118/62 | HR 62 | Ht 70.0 in | Wt 156.3 lb

## 2013-11-10 DIAGNOSIS — E7800 Pure hypercholesterolemia, unspecified: Secondary | ICD-10-CM

## 2013-11-10 DIAGNOSIS — E1165 Type 2 diabetes mellitus with hyperglycemia: Secondary | ICD-10-CM

## 2013-11-10 DIAGNOSIS — I639 Cerebral infarction, unspecified: Secondary | ICD-10-CM

## 2013-11-10 DIAGNOSIS — N3281 Overactive bladder: Secondary | ICD-10-CM

## 2013-11-10 DIAGNOSIS — IMO0002 Reserved for concepts with insufficient information to code with codable children: Secondary | ICD-10-CM

## 2013-11-10 DIAGNOSIS — Z23 Encounter for immunization: Secondary | ICD-10-CM

## 2013-11-10 MED ORDER — METFORMIN HCL 850 MG PO TABS: 850.0000 mg | ORAL_TABLET | Freq: Two times a day (BID) | ORAL | Status: DC

## 2013-11-10 MED ORDER — OXYBUTYNIN CHLORIDE ER 5 MG PO TB24: 10.0000 mg | ORAL_TABLET | Freq: Every day | ORAL | Status: DC

## 2013-11-10 NOTE — Progress Notes (Signed)
Subjective:    Patient ID: Lawrence Coleman, male    DOB: Apr 01, 1915, 80 y.o.   MRN: [id number]  HPI 80 year old white male, nonsmoker with history of type 2 diabetes and hypercholesterolemia is in today for hospital follow-up from 10/21/2013. There is currently doing well. He presented to the ER with hallucinations and seeing bugs crawling on the floor that weren't present. He is believed to have had a TIA. MRI revealed an old stroke but does not have any residual deficits. He was started on Seroquel 25 mg at bedtime as needed for agitation or hallucinations that's worked well. Last hemoglobin A1c was 9.9. He is currently taking metformin 500 mg twice a day. Reports fasting blood glucose readings between 140 and 200. His son is concerned that he has more accidents on his way to the bathroom attempting to urinate. Is requesting to try an overactive bladder medication. Urinalysis in the hospital was normal. Symptoms have been ongoing 3 months. No burning with urination or blood in the urine.   Review of Systems  Constitutional: Negative.   HENT: Negative.   Respiratory: Negative.   Cardiovascular: Negative.   Gastrointestinal: Negative.   Endocrine: Negative.   Genitourinary: Positive for urgency.  Musculoskeletal: Negative.   Skin: Negative.   Allergic/Immunologic: Negative.   Neurological: Negative.   Hematological: Negative.   Psychiatric/Behavioral: Positive for sleep disturbance and agitation. The patient is nervous/anxious.        Altered memory   Past Medical History  Diagnosis Date  . Hemorrhoids, internal   . Family hx of colon cancer   . Diverticular disease   . Hx of adenomatous colonic polyps   . Diabetes mellitus type II, controlled   . Urinary frequency     History   Social History  . Marital Status: Married    Spouse Name: N/A    Number of Children: N/A  . Years of Education: N/A   Occupational History  . Not on file.   Social History Main Topics  .  Smoking status: Never Smoker   . Smokeless tobacco: Never Used  . Alcohol Use: No  . Drug Use: No  . Sexual Activity: No   Other Topics Concern  . Not on file   Social History Narrative  . No narrative on file    Past Surgical History  Procedure Laterality Date  . Appendectomy      History reviewed. No pertinent family history.  Allergies  Allergen Reactions  . Penicillins Other (See Comments)    unknown    Current Outpatient Prescriptions on File Prior to Visit  Medication Sig Dispense Refill  . aspirin (BAYER LOW STRENGTH) 81 MG EC tablet Take 162 mg by mouth daily.       Marland Kitchen. atorvastatin (LIPITOR) 10 MG tablet Take 1 tablet (10 mg total) by mouth daily at 6 PM.  30 tablet  1  . QUEtiapine (SEROQUEL) 25 MG tablet Take 1 tablet (25 mg total) by mouth at bedtime as needed (Agitation, hallucinations).  30 tablet  1   No current facility-administered medications on file prior to visit.    BP 118/62  Pulse 62  Ht 5\' 10"  (1.778 m)  Wt 156 lb 4.8 oz (70.897 kg)  BMI 22.43 kg/m2chart    Objective:   Physical Exam  Constitutional: He is oriented to person, place, and time. He appears well-developed and well-nourished.  HENT:  Right Ear: External ear normal.  Left Ear: External ear normal.  Nose: Nose normal.  Mouth/Throat:  Oropharynx is clear and moist.  Neck: Normal range of motion. Neck supple. No thyromegaly present.  Cardiovascular: Normal rate, regular rhythm and normal heart sounds.   Pulmonary/Chest: Effort normal and breath sounds normal.  Abdominal: Soft. Bowel sounds are normal.  Musculoskeletal: Normal range of motion.  Ambulates with a walker  Neurological: He is alert and oriented to person, place, and time.  Confused at time  Skin: Skin is warm and dry.  Psychiatric: He has a normal mood and affect.          Assessment & Plan:  Lawrence Coleman was seen today for hospital f/u.  Diagnoses and associated orders for this visit:  CVA (cerebral vascular  accident)  Uncontrolled type 2 diabetes mellitus  Pure hypercholesterolemia  Overactive bladder  Encounter for immunization  Other Orders - metFORMIN (GLUCOPHAGE) 850 MG tablet; Take 1 tablet (850 mg total) by mouth 2 (two) times daily with a meal. - oxybutynin (DITROPAN XL) 5 MG 24 hr tablet; Take 2 tablets (10 mg total) by mouth at bedtime.   call the office with any questions or concerns. Recheck in 4 weeks to see how he is tolerating the medication adjustment and sooner as needed.

## 2013-11-10 NOTE — Patient Instructions (Signed)
Stroke Prevention Some medical conditions and behaviors are associated with an increased chance of having a stroke. You may prevent a stroke by making healthy choices and managing medical conditions. HOW CAN I REDUCE MY RISK OF HAVING A STROKE?   Stay physically active. Get at least 30 minutes of activity on most or all days.  Do not smoke. It may also be helpful to avoid exposure to secondhand smoke.  Limit alcohol use. Moderate alcohol use is considered to be:  No more than 2 drinks per day for men.  No more than 1 drink per day for nonpregnant women.  Eat healthy foods. This involves:  Eating 5 or more servings of fruits and vegetables a day.  Making dietary changes that address high blood pressure (hypertension), high cholesterol, diabetes, or obesity.  Manage your cholesterol levels.  Making food choices that are high in fiber and low in saturated fat, trans fat, and cholesterol may control cholesterol levels.  Take any prescribed medicines to control cholesterol as directed by your health care provider.  Manage your diabetes.  Controlling your carbohydrate and sugar intake is recommended to manage diabetes.  Take any prescribed medicines to control diabetes as directed by your health care provider.  Control your hypertension.  Making food choices that are low in salt (sodium), saturated fat, trans fat, and cholesterol is recommended to manage hypertension.  Take any prescribed medicines to control hypertension as directed by your health care provider.  Maintain a healthy weight.  Reducing calorie intake and making food choices that are low in sodium, saturated fat, trans fat, and cholesterol are recommended to manage weight.  Stop drug abuse.  Avoid taking birth control pills.  Talk to your health care provider about the risks of taking birth control pills if you are over 35 years old, smoke, get migraines, or have ever had a blood clot.  Get evaluated for sleep  disorders (sleep apnea).  Talk to your health care provider about getting a sleep evaluation if you snore a lot or have excessive sleepiness.  Take medicines only as directed by your health care provider.  For some people, aspirin or blood thinners (anticoagulants) are helpful in reducing the risk of forming abnormal blood clots that can lead to stroke. If you have the irregular heart rhythm of atrial fibrillation, you should be on a blood thinner unless there is a good reason you cannot take them.  Understand all your medicine instructions.  Make sure that other conditions (such as anemia or atherosclerosis) are addressed. SEEK IMMEDIATE MEDICAL CARE IF:   You have sudden weakness or numbness of the face, arm, or leg, especially on one side of the body.  Your face or eyelid droops to one side.  You have sudden confusion.  You have trouble speaking (aphasia) or understanding.  You have sudden trouble seeing in one or both eyes.  You have sudden trouble walking.  You have dizziness.  You have a loss of balance or coordination.  You have a sudden, severe headache with no known cause.  You have new chest pain or an irregular heartbeat. Any of these symptoms may represent a serious problem that is an emergency. Do not wait to see if the symptoms will go away. Get medical help at once. Call your local emergency services (911 in [place] Do not drive yourself to the hospital. Document Released: 02/06/2004 Document Revised: 05/15/2013 Document Reviewed: 07/01/2012 Exit[employer] Patient Information 2015 [employer]. This information is not intended to replace advice given   to you by your health care provider. Make sure you discuss any questions you have with your health care provider.  

## 2013-11-10 NOTE — Progress Notes (Signed)
Pre visit review using our clinic review tool, if applicable. No additional management support is needed unless otherwise documented below in the visit note. 

## 2013-12-12 ENCOUNTER — Ambulatory Visit: Payer: Medicare Other | Admitting: Family

## 2013-12-18 ENCOUNTER — Encounter: Payer: Self-pay | Admitting: Podiatry

## 2013-12-18 ENCOUNTER — Ambulatory Visit (INDEPENDENT_AMBULATORY_CARE_PROVIDER_SITE_OTHER): Payer: Medicare Other | Admitting: Podiatry

## 2013-12-18 VITALS — BP 132/70 | HR 71 | Resp 14 | Ht 69.0 in | Wt 165.0 lb

## 2013-12-18 DIAGNOSIS — M79676 Pain in unspecified toe(s): Secondary | ICD-10-CM

## 2013-12-18 DIAGNOSIS — B351 Tinea unguium: Secondary | ICD-10-CM

## 2013-12-18 DIAGNOSIS — I639 Cerebral infarction, unspecified: Secondary | ICD-10-CM

## 2013-12-18 DIAGNOSIS — L03031 Cellulitis of right toe: Secondary | ICD-10-CM

## 2013-12-18 MED ORDER — DOXYCYCLINE HYCLATE 100 MG PO TABS: 100.0000 mg | ORAL_TABLET | Freq: Two times a day (BID) | ORAL | Status: DC

## 2013-12-18 NOTE — Patient Instructions (Signed)
Take oral antibiotics on an empty stomach one twice a day 7 days  Diabetes and Foot Care Diabetes may cause you to have problems because of poor blood supply (circulation) to your feet and legs. This may cause the skin on your feet to become thinner, break easier, and heal more slowly. Your skin may become dry, and the skin may peel and crack. You may also have nerve damage in your legs and feet causing decreased feeling in them. You may not notice minor injuries to your feet that could lead to infections or more serious problems. Taking care of your feet is one of the most important things you can do for yourself.  HOME CARE INSTRUCTIONS  Wear shoes at all times, even in the house. Do not go barefoot. Bare feet are easily injured.  Check your feet daily for blisters, cuts, and redness. If you cannot see the bottom of your feet, use a mirror or ask someone for help.  Wash your feet with warm water (do not use hot water) and mild soap. Then pat your feet and the areas between your toes until they are completely dry. Do not soak your feet as this can dry your skin.  Apply a moisturizing lotion or petroleum jelly (that does not contain alcohol and is unscented) to the skin on your feet and to dry, brittle toenails. Do not apply lotion between your toes.  Trim your toenails straight across. Do not dig under them or around the cuticle. File the edges of your nails with an emery board or nail file.  Do not cut corns or calluses or try to remove them with medicine.  Wear clean socks or stockings every day. Make sure they are not too tight. Do not wear knee-high stockings since they may decrease blood flow to your legs.  Wear shoes that fit properly and have enough cushioning. To break in new shoes, wear them for just a few hours a day. This prevents you from injuring your feet. Always look in your shoes before you put them on to be sure there are no objects inside.  Do not cross your legs. This may  decrease the blood flow to your feet.  If you find a minor scrape, cut, or break in the skin on your feet, keep it and the skin around it clean and dry. These areas may be cleansed with mild soap and water. Do not cleanse the area with peroxide, alcohol, or iodine.  When you remove an adhesive bandage, be sure not to damage the skin around it.  If you have a wound, look at it several times a day to make sure it is healing.  Do not use heating pads or hot water bottles. They may burn your skin. If you have lost feeling in your feet or legs, you may not know it is happening until it is too late.  Make sure your health care provider performs a complete foot exam at least annually or more often if you have foot problems. Report any cuts, sores, or bruises to your health care provider immediately. SEEK MEDICAL CARE IF:   You have an injury that is not healing.  You have cuts or breaks in the skin.  You have an ingrown nail.  You notice redness on your legs or feet.  You feel burning or tingling in your legs or feet.  You have pain or cramps in your legs and feet.  Your legs or feet are numb.  Your feet  always feel cold. SEEK IMMEDIATE MEDICAL CARE IF:   There is increasing redness, swelling, or pain in or around a wound.  There is a red line that goes up your leg.  Pus is coming from a wound.  You develop a fever or as directed by your health care provider.  You notice a bad smell coming from an ulcer or wound. Document Released: 12/27/1999 Document Revised: 08/31/2012 Document Reviewed: 06/07/2012 Exit[employer] Patient Information 2015 [employer]. This information is not intended to replace advice given to you by your health care provider. Make sure you discuss any questions you have with your health care provider.  

## 2013-12-18 NOTE — Progress Notes (Signed)
   Subjective:    Patient ID: Lawrence MattesRobert G Coleman, male    DOB: [birth year], 80 y.o.   MRN: [id number]  HPI Comments: N debridement L 10 toenails D and O long-term C elongated, painful toenails A diabetic pt, difficult to cut T home pedicure   Diabetes      Review of Systems  All other systems reviewed and are negative.      Objective:   Physical Exam  Hearing-impaired orientated 3 patient presents with son  Vascular: Pitting edema left foot DP right 0/4 DP left 2/4 PT pulses 0/4 bilaterally  Neurological: Sensation to 10 g monofilament wire intact 2/5 bilaterally Vibratory sensation nonreactive bilaterally Ankle reflex equal and reactive bilaterally  Dermatological: Low-grade erythema and edema dorsal distal interphalangeal joint second right toe All the toenails are extremely elongated, hypertrophic, discolored 6-10  Musculoskeletal: HAV deformity right Pes planus bilaterally       Assessment & Plan:   Assessment: Edema left Diminished pedal pulses bilaterally Paronychia second right toe Neglected symptomatic onychomycoses 6-10  Plan: Debridement toenails 10 without a bleeding Rx doxycycline 100 mg by mouth twice a day 7 days  Reappoint at 3 months for nail debridement or sooner if patient son has concern.

## 2014-01-26 ENCOUNTER — Encounter: Payer: Self-pay | Admitting: Family

## 2014-01-26 ENCOUNTER — Ambulatory Visit (INDEPENDENT_AMBULATORY_CARE_PROVIDER_SITE_OTHER): Payer: Medicare Other | Admitting: Family

## 2014-01-26 ENCOUNTER — Telehealth: Payer: Self-pay | Admitting: Family

## 2014-01-26 VITALS — BP 118/70 | HR 66 | Temp 98.4°F | Ht 66.0 in | Wt 152.4 lb

## 2014-01-26 DIAGNOSIS — E1165 Type 2 diabetes mellitus with hyperglycemia: Secondary | ICD-10-CM

## 2014-01-26 DIAGNOSIS — I10 Essential (primary) hypertension: Secondary | ICD-10-CM

## 2014-01-26 DIAGNOSIS — E7800 Pure hypercholesterolemia, unspecified: Secondary | ICD-10-CM

## 2014-01-26 DIAGNOSIS — IMO0002 Reserved for concepts with insufficient information to code with codable children: Secondary | ICD-10-CM

## 2014-01-26 LAB — CBC WITH DIFFERENTIAL/PLATELET
BASOS ABS: 0 10*3/uL (ref 0.0–0.1)
Basophils Relative: 0.7 % (ref 0.0–3.0)
Eosinophils Absolute: 0.1 10*3/uL (ref 0.0–0.7)
Eosinophils Relative: 1.4 % (ref 0.0–5.0)
HEMATOCRIT: 41.5 % (ref 39.0–52.0)
Hemoglobin: 13.7 g/dL (ref 13.0–17.0)
LYMPHS ABS: 0.8 10*3/uL (ref 0.7–4.0)
Lymphocytes Relative: 13.5 % (ref 12.0–46.0)
MCHC: 32.9 g/dL (ref 30.0–36.0)
MCV: 91.7 fl (ref 78.0–100.0)
Monocytes Absolute: 0.6 10*3/uL (ref 0.1–1.0)
Monocytes Relative: 9.5 % (ref 3.0–12.0)
Neutro Abs: 4.6 10*3/uL (ref 1.4–7.7)
Neutrophils Relative %: 74.9 % (ref 43.0–77.0)
Platelets: 122 10*3/uL — ABNORMAL LOW (ref 150.0–400.0)
RBC: 4.53 Mil/uL (ref 4.22–5.81)
RDW: 14 % (ref 11.5–15.5)
WBC: 6.2 10*3/uL (ref 4.0–10.5)

## 2014-01-26 LAB — LIPID PANEL
Cholesterol: 145 mg/dL (ref 0–200)
HDL: 37.7 mg/dL — ABNORMAL LOW (ref >39.00)
LDL Cholesterol: 91 mg/dL (ref 0–99)
NONHDL: 107.3
TRIGLYCERIDES: 83 mg/dL (ref 0.0–149.0)
Total CHOL/HDL Ratio: 4
VLDL: 16.6 mg/dL (ref 0.0–40.0)

## 2014-01-26 LAB — HEPATIC FUNCTION PANEL
ALBUMIN: 3.8 g/dL (ref 3.5–5.2)
ALK PHOS: 81 U/L (ref 39–117)
ALT: 23 U/L (ref 0–53)
AST: 28 U/L (ref 0–37)
BILIRUBIN TOTAL: 0.9 mg/dL (ref 0.2–1.2)
Bilirubin, Direct: 0.2 mg/dL (ref 0.0–0.3)
Total Protein: 6.9 g/dL (ref 6.0–8.3)

## 2014-01-26 LAB — HEMOGLOBIN A1C: Hgb A1c MFr Bld: 6.9 % — ABNORMAL HIGH (ref 4.6–6.5)

## 2014-01-26 LAB — BASIC METABOLIC PANEL
BUN: 24 mg/dL — ABNORMAL HIGH (ref 6–23)
CALCIUM: 9.6 mg/dL (ref 8.4–10.5)
CHLORIDE: 105 meq/L (ref 96–112)
CO2: 30 mEq/L (ref 19–32)
Creatinine, Ser: 1.05 mg/dL (ref 0.40–1.50)
GFR: 69.6 mL/min (ref >60.00)
Glucose, Bld: 135 mg/dL — ABNORMAL HIGH (ref 70–99)
Potassium: 4.9 mEq/L (ref 3.5–5.1)
Sodium: 138 mEq/L (ref 135–145)

## 2014-01-26 LAB — MICROALBUMIN / CREATININE URINE RATIO
CREATININE, U: 112.3 mg/dL
Microalb Creat Ratio: 1.5 mg/g (ref 0.0–30.0)
Microalb, Ur: 1.7 mg/dL (ref 0.0–1.9)

## 2014-01-26 MED ORDER — GLUCOSE BLOOD VI STRP: ORAL_STRIP | Status: DC

## 2014-01-26 MED ORDER — CVS LANCETS 21G MISC: Status: DC

## 2014-01-26 NOTE — Telephone Encounter (Signed)
emmi mailed  °

## 2014-01-26 NOTE — Patient Instructions (Signed)
Diabetes and Standards of Medical Care Diabetes is complicated. You may find that your diabetes team includes a dietitian, nurse, diabetes educator, eye doctor, and more. To help everyone know what is going on and to help you get the care you deserve, the following schedule of care was developed to help keep you on track. Below are the tests, exams, vaccines, medicines, education, and plans you will need. HbA1c test This test shows how well you have controlled your glucose over the past 2-3 months. It is used to see if your diabetes management plan needs to be adjusted.   It is performed at least 2 times a year if you are meeting treatment goals.  It is performed 4 times a year if therapy has changed or if you are not meeting treatment goals. Blood pressure test  This test is performed at every routine medical visit. The goal is less than 140/90 mm Hg for most people, but 130/80 mm Hg in some cases. Ask your health care provider about your goal. Dental exam  Follow up with the dentist regularly. Eye exam  If you are diagnosed with type 1 diabetes as a child, get an exam upon reaching the age of 37 years or older and have had diabetes for 3-5 years. Yearly eye exams are recommended after that initial eye exam.  If you are diagnosed with type 1 diabetes as an adult, get an exam within 5 years of diagnosis and then yearly.  If you are diagnosed with type 2 diabetes, get an exam as soon as possible after the diagnosis and then yearly. Foot care exam  Visual foot exams are performed at every routine medical visit. The exams check for cuts, injuries, or other problems with the feet.  A comprehensive foot exam should be done yearly. This includes visual inspection as well as assessing foot pulses and testing for loss of sensation.  Check your feet nightly for cuts, injuries, or other problems with your feet. Tell your health care provider if anything is not healing. Kidney function test (urine  microalbumin)  This test is performed once a year.  Type 1 diabetes: The first test is performed 5 years after diagnosis.  Type 2 diabetes: The first test is performed at the time of diagnosis.  A serum creatinine and estimated glomerular filtration rate (eGFR) test is done once a year to assess the level of chronic kidney disease (CKD), if present. Lipid profile (cholesterol, HDL, LDL, triglycerides)  Performed every 5 years for most people.  The goal for LDL is less than 100 mg/dL. If you are at high risk, the goal is less than 70 mg/dL.  The goal for HDL is 40 mg/dL-50 mg/dL for men and 50 mg/dL-60 mg/dL for women. An HDL cholesterol of 60 mg/dL or higher gives some protection against heart disease.  The goal for triglycerides is less than 150 mg/dL. Influenza vaccine, pneumococcal vaccine, and hepatitis B vaccine  The influenza vaccine is recommended yearly.  It is recommended that people with diabetes who are over 24 years old get the pneumonia vaccine. In some cases, two separate shots may be given. Ask your health care provider if your pneumonia vaccination is up to date.  The hepatitis B vaccine is also recommended for adults with diabetes. Diabetes self-management education  Education is recommended at diagnosis and ongoing as needed. Treatment plan  Your treatment plan is reviewed at every medical visit. Document Released: 10/26/2008 Document Revised: 05/15/2013 Document Reviewed: 05/31/2012 [facility] Patient Information 2015 [place],  [employer]. This information is not intended to replace advice given to you by your health care provider. Make sure you discuss any questions you have with your health care provider.

## 2014-01-26 NOTE — Progress Notes (Signed)
Pre visit review using our clinic review tool, if applicable. No additional management support is needed unless otherwise documented below in the visit note. 

## 2014-01-29 ENCOUNTER — Encounter: Payer: Self-pay | Admitting: Family

## 2014-01-29 NOTE — Progress Notes (Signed)
Subjective:    Patient ID: Lawrence Coleman, male    DOB: 12/30/1915, 81 y.o.   MRN: [id number]  HPI  81 year old white male, nonsmoker with a history of BPH, type 2 diabetes, CVA, hyperlipidemia is in today for a recheck.   Reports he is been doing well. Denies any concerns. Blood glucose around the 150 range. Tolerates medications well. Ambulates with a walker   Review of Systems  Constitutional: Negative.   HENT: Negative.   Respiratory: Negative.   Cardiovascular: Negative.   Gastrointestinal: Negative.   Endocrine: Negative.   Genitourinary: Negative.   Musculoskeletal: Negative.        Ambulates with a walker.   Skin: Negative.   Allergic/Immunologic: Negative.   Neurological: Negative.   Hematological: Negative.   Psychiatric/Behavioral: Negative.    Past Medical History  Diagnosis Date  . Hemorrhoids, internal   . Family hx of colon cancer   . Diverticular disease   . Hx of adenomatous colonic polyps   . Diabetes mellitus type II, controlled   . Urinary frequency     History   Social History  . Marital Status: Married    Spouse Name: N/A    Number of Children: N/A  . Years of Education: N/A   Occupational History  . Not on file.   Social History Main Topics  . Smoking status: Never Smoker   . Smokeless tobacco: Never Used  . Alcohol Use: No  . Drug Use: No  . Sexual Activity: No   Other Topics Concern  . Not on file   Social History Narrative    Past Surgical History  Procedure Laterality Date  . Appendectomy      History reviewed. No pertinent family history.  Allergies  Allergen Reactions  . Penicillins Other (See Comments)    unknown    Current Outpatient Prescriptions on File Prior to Visit  Medication Sig Dispense Refill  . aspirin (BAYER LOW STRENGTH) 81 MG EC tablet Take 162 mg by mouth daily.     Marland Kitchen atorvastatin (LIPITOR) 10 MG tablet Take 1 tablet (10 mg total) by mouth daily at 6 PM. 30 tablet 1  . metFORMIN (GLUCOPHAGE)  850 MG tablet Take 1 tablet (850 mg total) by mouth 2 (two) times daily with a meal. 60 tablet 2  . oxybutynin (DITROPAN XL) 5 MG 24 hr tablet Take 2 tablets (10 mg total) by mouth at bedtime. 30 tablet 2  . QUEtiapine (SEROQUEL) 25 MG tablet Take 1 tablet (25 mg total) by mouth at bedtime as needed (Agitation, hallucinations). 30 tablet 1   No current facility-administered medications on file prior to visit.    BP 118/70 mmHg  Pulse 66  Temp(Src) 98.4 F (36.9 C) (Oral)  Ht  (1.676 m)  Wt 152 lb 6.4 oz (69.128 kg)  BMI 24.61 kg/m2chart     Objective:   Physical Exam  Constitutional: He is oriented to person, place, and time. He appears well-developed and well-nourished.  HENT:  Right Ear: External ear normal.  Left Ear: External ear normal.  Nose: Nose normal.  Mouth/Throat: Oropharynx is clear and moist.  Neck: Normal range of motion. Neck supple. No thyromegaly present.  Cardiovascular: Normal rate, regular rhythm and normal heart sounds.   Pulmonary/Chest: Effort normal and breath sounds normal.  Abdominal: Soft. Bowel sounds are normal.  Musculoskeletal: Normal range of motion.  Neurological: He is alert and oriented to person, place, and time.  Skin: Skin is warm and dry.  Assessment & Plan:  Molly MaduroRobert was seen today for follow-up.  Diagnoses and associated orders for this visit:  Type 2 diabetes mellitus, uncontrolled - Hemoglobin A1c - Lipid Panel - Basic Metabolic Panel - Hepatic Function Panel - CBC with Differential - Microalbumin/Creatinine Ratio, Urine  Essential hypertension - Hemoglobin A1c - Lipid Panel - Basic Metabolic Panel - Hepatic Function Panel - CBC with Differential - Microalbumin/Creatinine Ratio, Urine  Pure hypercholesterolemia - Hemoglobin A1c - Lipid Panel - Basic Metabolic Panel - Hepatic Function Panel - CBC with Differential - Microalbumin/Creatinine Ratio, Urine  Other Orders - glucose blood ([facility] BLOOD  GLUCOSE TEST STRIPS) test strip; Use to check blood sugar twice daily - [facility] Lancets MISC; Use ti check blood glucose twice daily - Hemoglobin A1c - Lipid panel - Basic metabolic panel - Hepatic function panel - CBC with Differential    continue current medications. Call the office with any questions or concerns. Recheck in 4 months and sooner as needed.

## 2014-02-05 ENCOUNTER — Other Ambulatory Visit: Payer: Self-pay | Admitting: Family

## 2014-02-05 ENCOUNTER — Telehealth: Payer: Self-pay | Admitting: Family

## 2014-02-05 DIAGNOSIS — IMO0002 Reserved for concepts with insufficient information to code with codable children: Secondary | ICD-10-CM

## 2014-02-05 DIAGNOSIS — E1165 Type 2 diabetes mellitus with hyperglycemia: Secondary | ICD-10-CM

## 2014-02-05 MED ORDER — GLUCOSE BLOOD VI STRP
ORAL_STRIP | Status: DC
Start: 2014-02-05 — End: 2014-04-17

## 2014-02-05 MED ORDER — CVS LANCETS 21G MISC
Status: DC
Start: 2014-02-05 — End: 2014-10-26

## 2014-02-05 NOTE — Telephone Encounter (Signed)
Son calling again. States his dad is out of supplies and he needs them today please.

## 2014-02-05 NOTE — Telephone Encounter (Signed)
Spoke with pharmacy to verify receipt and told they have no record. Verbal given and resent electronically  Called pharmacy again to ensure receipt. Both were received

## 2014-02-05 NOTE — Telephone Encounter (Signed)
[facility] testing [facility] sent 01/26/14 , testing strips and lancets, son would like to know if you could resend? [facility]/ [address]

## 2014-04-17 ENCOUNTER — Other Ambulatory Visit: Payer: Self-pay | Admitting: Family

## 2014-07-25 ENCOUNTER — Encounter: Payer: Self-pay | Admitting: Adult Health

## 2014-07-25 ENCOUNTER — Ambulatory Visit (INDEPENDENT_AMBULATORY_CARE_PROVIDER_SITE_OTHER): Payer: Medicare Other | Admitting: Adult Health

## 2014-07-25 VITALS — BP 120/68 | Temp 97.6°F | Ht 66.0 in | Wt 139.4 lb

## 2014-07-25 DIAGNOSIS — E1165 Type 2 diabetes mellitus with hyperglycemia: Secondary | ICD-10-CM | POA: Diagnosis not present

## 2014-07-25 DIAGNOSIS — IMO0002 Reserved for concepts with insufficient information to code with codable children: Secondary | ICD-10-CM

## 2014-07-25 DIAGNOSIS — Z7189 Other specified counseling: Secondary | ICD-10-CM | POA: Diagnosis not present

## 2014-07-25 DIAGNOSIS — Z7689 Persons encountering health services in other specified circumstances: Secondary | ICD-10-CM

## 2014-07-25 DIAGNOSIS — R3981 Functional urinary incontinence: Secondary | ICD-10-CM | POA: Diagnosis not present

## 2014-07-25 NOTE — Progress Notes (Signed)
HPI:  Lawrence Coleman is here to establish care. He is a frail 81 year old Caucasian male who presents to the office today with his 71 year old wife and son.He  has a past medical history of Hemorrhoids, internal; Family hx of colon cancer; Diverticular disease; adenomatous colonic polyps; Diabetes mellitus type II, controlled; and Urinary frequency.  Last PCP and physical: January 2016 with NP Hyman Hopes Immunizations:Prevnar 13 Diet: Good appetite. Eats two meals a day Exercise: N/A Colonoscopy: 2005   Has the following chronic problems that require follow up and concerns today:   Diabetes - Take metformin 850 mg. Blood sugars average about 140.   Urinary Incontinence - Has trouble on occasion making it to the toilet in time - urinary incontinence.   He has a living will. His son is trying to become power of attorney. Mr. Tukes does not want to be a DNR   ROS negative for unless reported above: fevers, chills,feeling poorly, unintentional weight loss, hearing or vision loss, chest pain, palpitations, leg claudication, struggling to breath,Not feeling congested in the chest, no orthopenia, no cough,no wheezing, normal appetite, no soft tissue swelling, no hemoptysis, melena, hematochezia, hematuria, loc, si, or thoughts of self harm.    Past Medical History  Diagnosis Date  . Hemorrhoids, internal   . Family hx of colon cancer   . Diverticular disease   . Hx of adenomatous colonic polyps   . Diabetes mellitus type II, controlled   . Urinary frequency     Past Surgical History  Procedure Laterality Date  . Appendectomy      No family history on file.  History   Social History  . Marital Status: Married    Spouse Name: N/A  . Number of Children: N/A  . Years of Education: N/A   Social History Main Topics  . Smoking status: Never Smoker   . Smokeless tobacco: Never Used  . Alcohol Use: No  . Drug Use: No  . Sexual Activity: No   Other Topics Concern  . None    Social History Narrative     Current outpatient prescriptions:  .  aspirin (BAYER LOW STRENGTH) 81 MG EC tablet, Take 162 mg by mouth daily. , Disp: , Rfl:  .  [facility]S ADVANCED GLUCOSE TEST test strip, USE TO CHECK BLOOD SUGAR TWICE DAILY, Disp: 100 each, Rfl: 3 .  [facility]S Lancets MISC, Use ti check blood glucose twice daily, Disp: 200 each, Rfl: 1 .  metFORMIN (GLUCOPHAGE) 850 MG tablet, TAKE 1 TABLET (850 MG TOTAL) BY MOUTH 2 (TWO) TIMES DAILY WITH A MEAL., Disp: 180 tablet, Rfl: 1 .  atorvastatin (LIPITOR) 10 MG tablet, Take 1 tablet (10 mg total) by mouth daily at 6 PM. (Patient not taking: Reported on 07/25/2014), Disp: 30 tablet, Rfl: 1 .  oxybutynin (DITROPAN XL) 5 MG 24 hr tablet, Take 2 tablets (10 mg total) by mouth at bedtime. (Patient not taking: Reported on 07/25/2014), Disp: 30 tablet, Rfl: 2 .  QUEtiapine (SEROQUEL) 25 MG tablet, Take 1 tablet (25 mg total) by mouth at bedtime as needed (Agitation, hallucinations). (Patient not taking: Reported on 07/25/2014), Disp: 30 tablet, Rfl: 1  EXAM:  Filed Vitals:   07/25/14 1402  BP: 120/68  Temp: 97.6 F (36.4 C)    Body mass index is 22.51 kg/(m^2).  GENERAL: vitals reviewed and listed above, alert, oriented to place. Thought that [place] was the president and thought that it was January. Frail, slumped over. Responds to verbal stimuli.   HEENT:  atraumatic, conjunttiva clear, no obvious abnormalities on inspection of external nose and ears. TM's visualized  NECK: Neck is soft and supple without masses, no adenopathy or thyromegaly, trachea midline, no JVD. Normal range of motion.   LUNGS: clear to auscultation bilaterally, no wheezes, rales or rhonchi, good air movement  CV: Regular rate and rhythm, normal S1/S2, no audible murmurs, gallops, or rubs. No carotid bruit and no peripheral edema.   MS: moves all extremities without noticeable abnormality. No edema noted. Uses a walker, unsteady gate.   Abd: Unable to  assess  Skin: warm and dry, no rash   Extremities: No clubbing, cyanosis, or edema. Capillary refill is WNL. Pulses intact bilaterally in upper  extremities. Unable to assess lower extremities.   Neuro: CN II-XII intact, sensation and reflexes normal throughout, 5/5 muscle strength in bilateral upper and lower extremities. Normal finger to nose. Normal rapid alternating movements. PSYCH: pleasant and cooperative, no obvious depression or anxiety  ASSESSMENT AND PLAN: 1. Encounter to establish care - Fall risk- continue to use walker. Remove all rugs in the home. Use pull up bars.  - No change in medications - Follow up in 6 months or sooner if needed.   2. Diabetes mellitus type 2, uncontrolled - Controlled, no change  3. Functional urinary incontinence - Has tried oxybutynin in the past and did not tolerate well.  - Will not place on any medication at this time due to anticholinergic effects and being elderly   Discussed the following assessment and plan:  No diagnosis found. -We reviewed the PMH, PSH, FH, SH, Meds and Allergies. -We provided refills for any medications we will prescribe as needed. -We addressed current concerns per orders and patient instructions. -We have asked for records for pertinent exams, studies, vaccines and notes from previous providers. -We have advised patient to follow up per instructions below.   -Patient advised to return or notify a provider immediately if symptoms worsen or persist or new concerns arise.  There are no Patient Instructions on file for this visit.   Shirline Freesory Maura Braaten, AGNP

## 2014-07-25 NOTE — Progress Notes (Signed)
Pre visit review using our clinic review tool, if applicable. No additional management support is needed unless otherwise documented below in the visit note. 

## 2014-07-25 NOTE — Patient Instructions (Signed)
It was great meeting you today Lawrence Coleman.   Please follow up with me in 6 months. If you need anything in the meantime, please let me know.

## 2014-08-14 ENCOUNTER — Encounter: Payer: Self-pay | Admitting: Internal Medicine

## 2014-09-11 ENCOUNTER — Other Ambulatory Visit: Payer: Self-pay | Admitting: Adult Health

## 2014-09-11 DIAGNOSIS — R4189 Other symptoms and signs involving cognitive functions and awareness: Secondary | ICD-10-CM

## 2014-09-18 ENCOUNTER — Ambulatory Visit: Payer: Medicare Other | Admitting: Adult Health

## 2014-09-20 ENCOUNTER — Encounter: Payer: Self-pay | Admitting: Adult Health

## 2014-09-20 ENCOUNTER — Ambulatory Visit (INDEPENDENT_AMBULATORY_CARE_PROVIDER_SITE_OTHER): Payer: Medicare Other | Admitting: Adult Health

## 2014-09-20 VITALS — BP 102/80 | Temp 97.5°F | Ht 66.0 in | Wt 141.1 lb

## 2014-09-20 DIAGNOSIS — R600 Localized edema: Secondary | ICD-10-CM | POA: Diagnosis not present

## 2014-09-20 DIAGNOSIS — Z23 Encounter for immunization: Secondary | ICD-10-CM | POA: Diagnosis not present

## 2014-09-20 DIAGNOSIS — R4189 Other symptoms and signs involving cognitive functions and awareness: Secondary | ICD-10-CM

## 2014-09-20 MED ORDER — FUROSEMIDE 20 MG PO TABS: 20.0000 mg | ORAL_TABLET | Freq: Every day | ORAL | Status: AC

## 2014-09-20 NOTE — Patient Instructions (Signed)
It was great seeing you again Lawrence Coleman.   I have sent in a prescription for Lasix . Take this every other day to help with the swelling. Make sure you keep well hydrated.   I will follow up with you regarding your blood work, if we need to change anything.

## 2014-09-20 NOTE — Progress Notes (Signed)
Pre visit review using our clinic review tool, if applicable. No additional management support is needed unless otherwise documented below in the visit note. 

## 2014-09-20 NOTE — Progress Notes (Signed)
Subjective:    Patient ID: Lawrence Coleman, male    DOB: 1915/01/29, 81 y.o.   MRN: [id number]  HPI  Mr. Gaertner in a 81 year old gentleman who has recently had his wife pass away, presents to the office today with his son.   1) His son would like to take over as Power of Attorney for his father. He feels as though his father cannot manage his finances. He endorses that there was a time where many of Mr. Marling's bills became delinquent because they had been tossed aside and were forgotten about. He feels as though his father has lost the ability to correctly manage his finances.   2) Lower extremity swelling. His son first noticed this a week ago. Denies any pain. The swelling is localized to bilateral ankles.   He is tolerating medications well, eating well and ambulates with a walker.   Review of Systems  Constitutional: Negative.   Respiratory: Negative.   Cardiovascular: Positive for leg swelling.  Gastrointestinal: Negative.   Genitourinary: Negative.   Musculoskeletal:       Walks with walker  Psychiatric/Behavioral: Positive for confusion. Negative for behavioral problems, sleep disturbance, decreased concentration and agitation. The patient is not nervous/anxious.    Past Medical History  Diagnosis Date  . Hemorrhoids, internal   . Family hx of colon cancer   . Diverticular disease   . Hx of adenomatous colonic polyps   . Diabetes mellitus type II, controlled   . Urinary frequency     Social History   Social History  . Marital Status: Married    Spouse Name: N/A  . Number of Children: N/A  . Years of Education: N/A   Occupational History  . Not on file.   Social History Main Topics  . Smoking status: Never Smoker   . Smokeless tobacco: Never Used  . Alcohol Use: No  . Drug Use: No  . Sexual Activity: No   Other Topics Concern  . Not on file   Social History Narrative    Past Surgical History  Procedure Laterality Date  . Appendectomy      No  family history on file.  Allergies  Allergen Reactions  . Penicillins Other (See Comments)    unknown    Current Outpatient Prescriptions on File Prior to Visit  Medication Sig Dispense Refill  . aspirin (BAYER LOW STRENGTH) 81 MG EC tablet Take 162 mg by mouth daily.     . [employer] ADVANCED GLUCOSE TEST test strip USE TO CHECK BLOOD SUGAR TWICE DAILY 100 each 3  . [facility] Lancets MISC Use ti check blood glucose twice daily 200 each 1  . metFORMIN (GLUCOPHAGE) 850 MG tablet TAKE 1 TABLET (850 MG TOTAL) BY MOUTH 2 (TWO) TIMES DAILY WITH A MEAL. 180 tablet 1   No current facility-administered medications on file prior to visit.    BP 102/80 mmHg  Temp(Src) 97.5 F (36.4 C) (Oral)  Ht 5\' 6"  (1.676 m)  Wt 141 lb 1.6 oz (64.003 kg)  BMI 22.79 kg/m2       Objective:   Physical Exam  Constitutional: He appears well-developed and well-nourished. No distress.  Cardiovascular: Normal rate, regular rhythm, normal heart sounds and intact distal pulses.   Pulmonary/Chest: Effort normal and breath sounds normal. No respiratory distress. He has no wheezes. He has no rales. He exhibits no tenderness.  Neurological: He is alert. He has normal reflexes.  Oriented to place    Skin: Skin is  warm and dry. No rash noted. He is not diaphoretic. No erythema. No pallor.  Psychiatric: He has a normal mood and affect. His behavior is normal. Judgment and thought content normal.  Nursing note and vitals reviewed.      Assessment & Plan:  1. Impaired cognition  Mini Mental Status Exam Score 18/30 Missed - Orientation ( all questions) -Immediate Recall ( flag)  - Attention ( two letters in word "WORLD") - Delayed Verbal Recall ( all three words) - Copying ( unable to draw diagram)  He was able to draw and number a clock without difficulty.   - Will write letter - Reviewed with Dr. Caryl Never and he is in agreement of exam   2. Bilateral edema of lower extremity - Basic metabolic panel - furosemide  (LASIX) 20 MG tablet; Take 1 tablet (20 mg total) by mouth daily.  Dispense: 30 tablet; Refill: 3 - Basic metabolic panel  3. Encounter for immunization - Flu Shot given

## 2014-09-21 LAB — BASIC METABOLIC PANEL
BUN: 25 mg/dL — ABNORMAL HIGH (ref 6–23)
CALCIUM: 9.3 mg/dL (ref 8.4–10.5)
CO2: 29 mEq/L (ref 19–32)
Chloride: 103 mEq/L (ref 96–112)
Creatinine, Ser: 0.93 mg/dL (ref 0.40–1.50)
GFR: 79.95 mL/min (ref >60.00)
GLUCOSE: 177 mg/dL — AB (ref 70–99)
POTASSIUM: 4.2 meq/L (ref 3.5–5.1)
SODIUM: 141 meq/L (ref 135–145)

## 2014-10-01 ENCOUNTER — Encounter: Payer: Self-pay | Admitting: Adult Health

## 2014-10-02 ENCOUNTER — Telehealth: Payer: Self-pay | Admitting: Adult Health

## 2014-10-02 NOTE — Telephone Encounter (Signed)
Left VM with Peyton Najjar ( son) informing him that his letter was finished for his father

## 2014-10-04 ENCOUNTER — Telehealth: Payer: Self-pay | Admitting: Adult Health

## 2014-10-04 NOTE — Telephone Encounter (Signed)
Letter regarding mental capacity sent in mail to Tremayne's son, Peyton Najjar

## 2014-10-08 ENCOUNTER — Telehealth: Payer: Self-pay | Admitting: Adult Health

## 2014-10-08 ENCOUNTER — Other Ambulatory Visit: Payer: Self-pay | Admitting: Family

## 2014-10-08 NOTE — Telephone Encounter (Signed)
Lawrence Coleman pt son would like cory to return his call concerning a letter cory is working on.

## 2014-10-08 NOTE — Telephone Encounter (Signed)
Spoke to patient on the phone and informed him that I had sent the letter he was requesting in the mail.

## 2014-10-26 ENCOUNTER — Encounter: Payer: Self-pay | Admitting: Neurology

## 2014-10-26 ENCOUNTER — Ambulatory Visit (INDEPENDENT_AMBULATORY_CARE_PROVIDER_SITE_OTHER): Payer: Medicare Other | Admitting: Neurology

## 2014-10-26 VITALS — BP 106/58 | HR 100 | Wt 134.0 lb

## 2014-10-26 DIAGNOSIS — F03B0 Unspecified dementia, moderate, without behavioral disturbance, psychotic disturbance, mood disturbance, and anxiety: Secondary | ICD-10-CM | POA: Insufficient documentation

## 2014-10-26 DIAGNOSIS — F0390 Unspecified dementia without behavioral disturbance: Secondary | ICD-10-CM | POA: Diagnosis not present

## 2014-10-26 NOTE — Patient Instructions (Signed)
1. Continue supportive care, fall precautions 2. Follow-up with PCP as scheduled 3. Call our office for any problems

## 2014-10-26 NOTE — Progress Notes (Addendum)
NEUROLOGY CONSULTATION NOTE  Lawrence Coleman MRN: [id number] DOB: Oct 27, 1915  Referring provider: Shirline Frees, NP Primary care provider: Shirline Frees, NP  Reason for consult:  Memory loss  Thank you for your kind referral of Lawrence Coleman for consultation of the above symptoms. Although his history is well known to you, please allow me to reiterate it for the purpose of our medical record. The patient was accompanied to the clinic by his son who also provides collateral information. Records and images were personally reviewed where available.  HISTORY OF PRESENT ILLNESS: This is a 81 year old right-handed man with a history of diabetes, hypertension, presenting for evaluation of dementia, his son reports he needs a letter from another physician for him to access their Trust now that he cannot manage finances effectively, as his son has been managing his father's finances for the past 2-1/2 years. The patient reports his memory is "not too good, not too bad." His son has been living with him since 2013 when his mother got sick until she passed away last 2022/09/16. His father continues to ask where she is. He had been misplacing bills or read them for a long time, forgetting what was on the top of the letter, so for the past 2-1/2 years, his son would write the checks and have the patient sign them. He stopped driving in 1914 after he got lost driving and his son had to pick him up. His son has been administering his medications. He had a fall several months ago while dressing himself, which he can still do but with some assistance. He has stopped showering but can wash himself on the sink. His son brings a urinal to him. He has intermittent back pain. Otherwise he denies any headaches, dizziness, diplopia, focal numbness/tingling/weakness, bowel/bladder incontinence.   He had an MRI brain in 10/2013 which showed advanced generalized atrophy and white matter disease. There was a punctate  nonhemorrhagic infarct in the right parietal post-central gyrus.  Laboratory Data: Lab Results  Component Value Date   WBC 6.2 01/26/2014   HGB 13.7 01/26/2014   HCT 41.5 01/26/2014   MCV 91.7 01/26/2014   PLT 122.0* 01/26/2014     Chemistry      Component Value Date/Time   NA 141 09/20/2014 1559   K 4.2 09/20/2014 1559   CL 103 09/20/2014 1559   CO2 29 09/20/2014 1559   BUN 25* 09/20/2014 1559   CREATININE 0.93 09/20/2014 1559      Component Value Date/Time   CALCIUM 9.3 09/20/2014 1559   ALKPHOS 81 01/26/2014 1516   AST 28 01/26/2014 1516   ALT 23 01/26/2014 1516   BILITOT 0.9 01/26/2014 1516     Lab Results  Component Value Date   TSH 1.660 10/27/2013   Lab Results  Component Value Date   VITAMINB12 524 10/27/2013     PAST MEDICAL HISTORY: Past Medical History  Diagnosis Date  . Hemorrhoids, internal   . Family hx of colon cancer   . Diverticular disease   . Hx of adenomatous colonic polyps   . Diabetes mellitus type II, controlled ([facility])   . Urinary frequency     PAST SURGICAL HISTORY: Past Surgical History  Procedure Laterality Date  . Appendectomy      MEDICATIONS: Current Outpatient Prescriptions on File Prior to Visit  Medication Sig Dispense Refill  . furosemide (LASIX) 20 MG tablet Take 1 tablet (20 mg total) by mouth daily. (Patient taking differently: Take 20 mg  by mouth as needed. ) 30 tablet 3  . metFORMIN (GLUCOPHAGE) 850 MG tablet TAKE 1 TABLET (850 MG TOTAL) BY MOUTH 2 (TWO) TIMES DAILY WITH A MEAL. 180 tablet 1   No current facility-administered medications on file prior to visit.    ALLERGIES: Allergies  Allergen Reactions  . Penicillins Other (See Comments)    unknown    FAMILY HISTORY: No family history on file.  SOCIAL HISTORY: Social History   Social History  . Marital Status: Married    Spouse Name: N/A  . Number of Children: N/A  . Years of Education: N/A   Occupational History  . Not on file.   Social  History Main Topics  . Smoking status: Never Smoker   . Smokeless tobacco: Never Used  . Alcohol Use: No  . Drug Use: No  . Sexual Activity: No   Other Topics Concern  . Not on file   Social History Narrative    REVIEW OF SYSTEMS: Constitutional: No fevers, chills, or sweats, no generalized fatigue, change in appetite Eyes: No visual changes, double vision, eye pain Ear, nose and throat: No hearing loss, ear pain, nasal congestion, sore throat Cardiovascular: No chest pain, palpitations Respiratory:  No shortness of breath at rest or with exertion, wheezes GastrointestinaI: No nausea, vomiting, diarrhea, abdominal pain, fecal incontinence Genitourinary:  No dysuria, urinary retention or frequency Musculoskeletal:  No neck pain, +back pain Integumentary: No rash, pruritus, skin lesions Neurological: as above Psychiatric: No depression, insomnia, anxiety Endocrine: No palpitations, fatigue, diaphoresis, mood swings, change in appetite, change in weight, increased thirst Hematologic/Lymphatic:  No anemia, purpura, petechiae. Allergic/Immunologic: no itchy/runny eyes, nasal congestion, recent allergic reactions, rashes  PHYSICAL EXAM: Filed Vitals:   10/26/14 1334  BP: 106/58  Pulse: 100   General: No acute distress, sitting on wheelchair hunched forward Head:  Normocephalic/atraumatic Eyes: Fundoscopic exam shows bilateral sharp discs, no vessel changes, exudates, or hemorrhages Neck: supple, no paraspinal tenderness, full range of motion Back: No paraspinal tenderness Heart: regular rate and rhythm Lungs: Clear to auscultation bilaterally. Vascular: No carotid bruits. Skin/Extremities: No rash, no edema Neurological Exam: Mental status: alert and oriented to person, city/state, day of week and season. No dysarthria or aphasia, Fund of knowledge is appropriate.  Recent and remote memory are impaired.  Attention and concentration are normal.    Able to name objects and repeat  phrases. Clock drawing test 3/5. MMSE - Mini Mental State Exam 10/26/2014  Orientation to time 2  Orientation to Place 2  Registration 3  Attention/ Calculation 4  Recall 0  Language- name 2 objects 2  Language- repeat 1  Language- follow 3 step command 2  Language- read & follow direction 1  Write a sentence 0  Copy design 1  Total score 18   Cranial nerves: CN I: not tested CN II: pupils equal, round and reactive to light, visual fields intact, fundi unremarkable. CN III, IV, VI:  full range of motion, no nystagmus, no ptosis CN V: facial sensation intact CN VII: upper and lower face symmetric CN VIII: hearing intact to finger rub CN IX, X: gag intact, uvula midline CN XI: sternocleidomastoid and trapezius muscles intact CN XII: tongue midline Bulk & Tone: normal, no fasciculations. Motor: 5/5 throughout with no pronator drift. Sensation: intact to light touch, cold, pin, vibration and joint position sense.  No extinction to double simultaneous stimulation.  Romberg test negative Deep Tendon Reflexes: +1 throughout, no ankle clonus Plantar responses: downgoing bilaterally Cerebellar: no  incoordination on finger to nose testing Gait: ambulates with walker at home, able to stand from wheelchair, hunched posture, did not take steps, unsteady Tremor: none  IMPRESSION: This is a 81 year old right-handed man with a history of hypertension, diabetes, small stroke in 2015, presenting for evaluation of worsening memory. His MMSE today is 18/30, indicating moderate dementia. He has been unable to manage his finances effectively for the past 2-1/2 years. He does not drive. We discussed that at this point, I do not feel strongly about using cholinesterase inhibitors such as Aricept, discussing side effects and expectations from the medication, which his son agrees with. Continue control of vascular risk factors, daily aspirin for secondary stroke prevention. We discussed home safety and fall  precautions. His son is requesting a letter for him to be able to manage his father's finances better, which will be done for him. He will follow-up on an as needed basis.   Thank you for allowing me to participate in the care of this patient. Please do not hesitate to call for any questions or concerns.   Patrcia Dolly, M.D.  CC: Shirline Frees, NP

## 2014-10-30 ENCOUNTER — Telehealth: Payer: Self-pay | Admitting: Neurology

## 2014-10-30 NOTE — Telephone Encounter (Signed)
error 

## 2014-11-01 ENCOUNTER — Encounter: Payer: Self-pay | Admitting: Neurology

## 2014-11-02 ENCOUNTER — Telehealth: Payer: Self-pay | Admitting: Neurology

## 2014-11-02 NOTE — Telephone Encounter (Signed)
Forgot to tell you that he said not to leave a voice mail message because he can't get into his messages/[name]

## 2014-11-02 NOTE — Telephone Encounter (Signed)
Pt's son Peyton NajjarLarry called in regards to a letter Dr Karel JarvisAquino was going to write to indicate PT is no longer able to handle his financial responsibilities/[name] CB# [phone]

## 2014-11-02 NOTE — Telephone Encounter (Signed)
Returned call. Notified him that letter was ready for pick up at the front desk.

## 2014-12-24 ENCOUNTER — Other Ambulatory Visit: Payer: Self-pay | Admitting: Family

## 2014-12-25 ENCOUNTER — Telehealth: Payer: Self-pay | Admitting: Adult Health

## 2014-12-25 NOTE — Telephone Encounter (Signed)
Patient son called [facility] regarding medication that the patient is supposed to be taken. Patient son was looking at a medication list with this drug on it Atorvastatin and Oxybutynin. Patient son wants to verify if the patient is supposed to be on these medication. Patient son Luana ShuLarry Pennella can be reached at  [phone].

## 2014-12-26 ENCOUNTER — Telehealth: Payer: Self-pay | Admitting: Adult Health

## 2014-12-26 NOTE — Telephone Encounter (Signed)
Pt son is return cory call

## 2014-12-26 NOTE — Telephone Encounter (Signed)
Spoke to LimavilleLarry, patients son about stopping Atorvastatin and Oxybutynin. I am fine with him not taking his statin. Peyton NajjarLarry reports that Molly MaduroRobert has not been taking Oxybutyin and is doing fine. Ok to d/c this medication as well.

## 2014-12-26 NOTE — Telephone Encounter (Signed)
Left VM to call back regarding his fathers medications

## 2015-01-21 ENCOUNTER — Ambulatory Visit: Payer: Medicare Other | Admitting: Adult Health

## 2016-01-13 DEATH — deceased
